# Patient Record
Sex: Male | Born: 1945 | Race: White | Hispanic: No | Marital: Single | Smoking: Never smoker
Health system: Southern US, Community
[De-identification: ages and names within clinical notes are randomized; demographics above are authoritative.]

---

## 1999-04-02 ENCOUNTER — Other Ambulatory Visit: Admission: RE | Admit: 1999-04-02 | Discharge: 1999-04-02 | Payer: Self-pay | Admitting: Oncology

## 1999-06-06 ENCOUNTER — Encounter: Payer: Self-pay | Admitting: Rheumatology

## 1999-06-06 ENCOUNTER — Encounter: Admission: RE | Admit: 1999-06-06 | Discharge: 1999-06-06 | Payer: Self-pay | Admitting: Rheumatology

## 1999-07-18 ENCOUNTER — Encounter: Admission: RE | Admit: 1999-07-18 | Discharge: 1999-07-18 | Payer: Self-pay | Admitting: Family Medicine

## 1999-07-18 ENCOUNTER — Encounter: Payer: Self-pay | Admitting: Family Medicine

## 2007-01-14 ENCOUNTER — Encounter: Admission: RE | Admit: 2007-01-14 | Discharge: 2007-01-14 | Payer: Self-pay | Admitting: Family Medicine

## 2007-01-17 ENCOUNTER — Ambulatory Visit (HOSPITAL_COMMUNITY): Admission: RE | Admit: 2007-01-17 | Discharge: 2007-01-17 | Payer: Self-pay | Admitting: Family Medicine

## 2008-02-07 ENCOUNTER — Ambulatory Visit: Payer: Self-pay | Admitting: Diagnostic Radiology

## 2008-02-07 ENCOUNTER — Ambulatory Visit (HOSPITAL_BASED_OUTPATIENT_CLINIC_OR_DEPARTMENT_OTHER): Admission: RE | Admit: 2008-02-07 | Discharge: 2008-02-07 | Payer: Self-pay | Admitting: Family Medicine

## 2017-11-22 ENCOUNTER — Encounter (HOSPITAL_COMMUNITY): Payer: Self-pay | Admitting: Emergency Medicine

## 2017-11-22 ENCOUNTER — Other Ambulatory Visit: Payer: Self-pay

## 2017-11-22 ENCOUNTER — Inpatient Hospital Stay (HOSPITAL_COMMUNITY)
Admission: EM | Admit: 2017-11-22 | Discharge: 2017-11-25 | DRG: 443 | Disposition: A | Discharge status: Home / Self Care | Payer: Medicare Other | Source: Ambulatory Visit | Attending: Internal Medicine | Admitting: Internal Medicine

## 2017-11-22 ENCOUNTER — Emergency Department (HOSPITAL_COMMUNITY): Payer: Medicare Other

## 2017-11-22 DIAGNOSIS — B179 Acute viral hepatitis, unspecified: Secondary | ICD-10-CM | POA: Diagnosis not present

## 2017-11-22 DIAGNOSIS — Z791 Long term (current) use of non-steroidal anti-inflammatories (NSAID): Secondary | ICD-10-CM

## 2017-11-22 DIAGNOSIS — Z833 Family history of diabetes mellitus: Secondary | ICD-10-CM

## 2017-11-22 DIAGNOSIS — R740 Nonspecific elevation of levels of transaminase and lactic acid dehydrogenase [LDH]: Secondary | ICD-10-CM | POA: Diagnosis not present

## 2017-11-22 DIAGNOSIS — M549 Dorsalgia, unspecified: Secondary | ICD-10-CM | POA: Diagnosis present

## 2017-11-22 DIAGNOSIS — K7200 Acute and subacute hepatic failure without coma: Secondary | ICD-10-CM | POA: Insufficient documentation

## 2017-11-22 DIAGNOSIS — B169 Acute hepatitis B without delta-agent and without hepatic coma: Secondary | ICD-10-CM | POA: Diagnosis present

## 2017-11-22 DIAGNOSIS — Z8249 Family history of ischemic heart disease and other diseases of the circulatory system: Secondary | ICD-10-CM | POA: Diagnosis not present

## 2017-11-22 DIAGNOSIS — K7290 Hepatic failure, unspecified without coma: Secondary | ICD-10-CM | POA: Insufficient documentation

## 2017-11-22 DIAGNOSIS — F329 Major depressive disorder, single episode, unspecified: Secondary | ICD-10-CM | POA: Diagnosis present

## 2017-11-22 DIAGNOSIS — G8929 Other chronic pain: Secondary | ICD-10-CM | POA: Diagnosis present

## 2017-11-22 DIAGNOSIS — M545 Low back pain: Secondary | ICD-10-CM | POA: Diagnosis not present

## 2017-11-22 DIAGNOSIS — R7401 Elevation of levels of liver transaminase levels: Secondary | ICD-10-CM

## 2017-11-22 LAB — COMPREHENSIVE METABOLIC PANEL
ALBUMIN: 3.1 g/dL — AB (ref 3.5–5.0)
ALT: 3122 U/L — ABNORMAL HIGH (ref 0–44)
ANION GAP: 6 (ref 5–15)
AST: 2438 U/L — ABNORMAL HIGH (ref 15–41)
Alkaline Phosphatase: 157 U/L — ABNORMAL HIGH (ref 38–126)
BUN: 9 mg/dL (ref 8–23)
CHLORIDE: 107 mmol/L (ref 98–111)
CO2: 24 mmol/L (ref 22–32)
Calcium: 8.6 mg/dL — ABNORMAL LOW (ref 8.9–10.3)
Creatinine, Ser: 0.55 mg/dL — ABNORMAL LOW (ref 0.61–1.24)
GFR calc non Af Amer: >60 mL/min (ref >60)
GLUCOSE: 108 mg/dL — AB (ref 70–99)
POTASSIUM: 3.1 mmol/L — AB (ref 3.5–5.1)
SODIUM: 137 mmol/L (ref 135–145)
Total Bilirubin: 15.7 mg/dL — ABNORMAL HIGH (ref 0.3–1.2)
Total Protein: 7.1 g/dL (ref 6.5–8.1)

## 2017-11-22 LAB — URINALYSIS, ROUTINE W REFLEX MICROSCOPIC
Glucose, UA: NEGATIVE mg/dL
HGB URINE DIPSTICK: NEGATIVE
KETONES UR: NEGATIVE mg/dL
LEUKOCYTES UA: NEGATIVE
Nitrite: NEGATIVE
Protein, ur: 30 mg/dL — AB
Specific Gravity, Urine: 1.023 (ref 1.005–1.030)
pH: 6 (ref 5.0–8.0)

## 2017-11-22 LAB — CBC
HEMATOCRIT: 38.6 % — AB (ref 39.0–52.0)
HEMOGLOBIN: 13.2 g/dL (ref 13.0–17.0)
MCH: 30.8 pg (ref 26.0–34.0)
MCHC: 34.2 g/dL (ref 30.0–36.0)
MCV: 90 fL (ref 80.0–100.0)
NRBC: 0.3 % — AB (ref 0.0–0.2)
Platelets: 138 10*3/uL — ABNORMAL LOW (ref 150–400)
RBC: 4.29 MIL/uL (ref 4.22–5.81)
RDW: 16.3 % — ABNORMAL HIGH (ref 11.5–15.5)
WBC: 7 10*3/uL (ref 4.0–10.5)

## 2017-11-22 LAB — LIPASE, BLOOD: LIPASE: 72 U/L — AB (ref 11–51)

## 2017-11-22 LAB — ACETAMINOPHEN LEVEL: Acetaminophen (Tylenol), Serum: <10 ug/mL — ABNORMAL LOW (ref 10–30)

## 2017-11-22 LAB — PROTIME-INR
INR: 1.44
PROTHROMBIN TIME: 17.3 s — AB (ref 11.4–15.2)

## 2017-11-22 MED ORDER — IOPAMIDOL (ISOVUE-300) INJECTION 61%
INTRAVENOUS | Status: AC
  Administered 2017-11-22: 100 mL via INTRAVENOUS
  Filled 2017-11-22: qty 100

## 2017-11-22 MED ORDER — IOPAMIDOL (ISOVUE-300) INJECTION 61%
100.0000 mL | Freq: Once | INTRAVENOUS | Status: AC | PRN
  Administered 2017-11-22: 100 mL via INTRAVENOUS

## 2017-11-22 MED ORDER — SODIUM CHLORIDE 0.9 % IJ SOLN
INTRAMUSCULAR | Status: AC
  Filled 2017-11-22: qty 50

## 2017-11-22 MED ORDER — ONDANSETRON HCL 4 MG/2ML IJ SOLN
4.0000 mg | Freq: Once | INTRAMUSCULAR | Status: AC
  Administered 2017-11-22: 4 mg via INTRAVENOUS
  Filled 2017-11-22: qty 2

## 2017-11-22 MED ORDER — HYDROMORPHONE HCL 1 MG/ML IJ SOLN
1.0000 mg | Freq: Once | INTRAMUSCULAR | Status: AC
  Administered 2017-11-22: 1 mg via INTRAVENOUS
  Filled 2017-11-22: qty 1

## 2017-11-22 NOTE — ED Notes (Signed)
PT states he feels "sick to his stomach" after pain medication administration. Dr. Donnald Garre made aware and verbal order given for zofran.

## 2017-11-22 NOTE — ED Provider Notes (Signed)
[facility]-EMERGENCY DEPT Provider Note   CSN: [id number] Arrival date & time: 11/22/17  1631     History   Chief Complaint Chief Complaint  Patient presents with  . Abnormal Lab    HPI Peter Lara is a 72 y.o. male.  HPI Patient reports that he started developing some lower abdominal discomfort about 8 days ago.  He reports that it was generalized and aching in nature.  He denies any upper abdominal pain.  Ports he developed diarrhea for period of time.  No fever that he is aware of.  No vomiting.  No vomiting of blood.  He is under pain management for chronic lower back pain.  He takes Vicodin, 1 tablet 4 times a day.  He denies that he takes other acetaminophen products or takes more than his prescribed amount.  He denies any alcohol history.  He denies history of liver problems.  When he went to his regular follow-up at pain management today, he was told that he looked jaundiced and he needed to see his primary care doctor go to the emergency department.  He reports he went to his PCP who advised him to come to the emergency department. History reviewed. No pertinent past medical history.  Patient Active Problem List   Diagnosis Date Noted  . Hepatic failure (HCC) 11/22/2017  . Acute hepatic failure 11/22/2017    History reviewed. No pertinent surgical history.      Home Medications    Prior to Admission medications   Medication Sig Start Date End Date Taking? Authorizing Provider  Carboxymethylcellulose Sodium (EYE DROPS OP) Apply 1 drop to eye daily as needed (dry eyes).   Yes [provider]  gabapentin (NEURONTIN) 600 MG tablet Take 600 mg by mouth 3 (three) times daily.    Yes [provider]  HYDROcodone-acetaminophen (NORCO) 7.5-325 MG tablet Take 1 tablet by mouth every 6 (six) hours as needed for pain.   Yes [provider]  meloxicam (MOBIC) 15 MG tablet Take 15 mg by mouth daily.   Yes [provider]  mirtazapine (REMERON) 15 MG tablet Take 15 mg by mouth at bedtime.   Yes [provider]  sertraline (ZOLOFT) 50 MG tablet Take 75 mg by mouth daily.    Yes [provider]    Family History Family History  Problem Relation Age of Onset  . CAD Father   . Diabetes Mellitus II Brother     Social History Social History   Tobacco Use  . Smoking status: Never Smoker  . Smokeless tobacco: Never Used  Substance Use Topics  . Alcohol use: Not Currently  . Drug use: Not on file     Allergies   Patient has no known allergies.   Review of Systems Review of Systems  10 Systems reviewed and are negative for acute change except as noted in the HPI.  Physical Exam Updated Vital Signs BP 138/80 (BP Location: Right Arm)   Pulse 67   Temp 98.3 F (36.8 C)   Resp 14   SpO2 96%   Physical Exam  Constitutional: He is oriented to person, place, and time.  Patient is alert with clear mental status.  No respiratory distress.  Nontoxic.  Obviously jaundiced.  HENT:  Head: Normocephalic and atraumatic.  Mouth/Throat: Oropharynx is clear and moist.  Eyes: Pupils are equal, round, and reactive to light. EOM are normal. Scleral icterus is present.  Neck: Neck supple.  Cardiovascular: Normal rate, regular  rhythm, normal heart sounds and intact distal pulses.  Pulmonary/Chest: Effort normal and breath sounds normal.  Abdominal: Soft. He exhibits no distension. There is no tenderness. There is no guarding.  Musculoskeletal: Normal range of motion. He exhibits no edema or tenderness.  Neurological: He is alert and oriented to person, place, and time. No cranial nerve deficit. He exhibits normal muscle tone. Coordination normal.  Skin: Skin is warm and dry.  Patient is very jaundiced in appearance.  Psychiatric: He has a normal mood and affect.     ED Treatments / Results  Labs (all labs ordered are listed, but only abnormal results are displayed) Labs Reviewed    LIPASE, BLOOD - Abnormal; Notable for the following components:      Result Value   Lipase 72 (*)    All other components within normal limits  COMPREHENSIVE METABOLIC PANEL - Abnormal; Notable for the following components:   Potassium 3.1 (*)    Glucose, Bld 108 (*)    Creatinine, Ser 0.55 (*)    Calcium 8.6 (*)    Albumin 3.1 (*)    AST 2,438 (*)    ALT 3,122 (*)    Alkaline Phosphatase 157 (*)    Total Bilirubin 15.7 (*)    All other components within normal limits  CBC - Abnormal; Notable for the following components:   HCT 38.6 (*)    RDW 16.3 (*)    Platelets 138 (*)    nRBC 0.3 (*)    All other components within normal limits  URINALYSIS, ROUTINE W REFLEX MICROSCOPIC - Abnormal; Notable for the following components:   Color, Urine AMBER (*)    APPearance HAZY (*)    Bilirubin Urine MODERATE (*)    Protein, ur 30 (*)    Bacteria, UA RARE (*)    All other components within normal limits  PROTIME-INR - Abnormal; Notable for the following components:   Prothrombin Time 17.3 (*)    All other components within normal limits  ACETAMINOPHEN LEVEL - Abnormal; Notable for the following components:   Acetaminophen (Tylenol), Serum <10 (*)    All other components within normal limits    EKG None  Radiology Ct Chest W Contrast  Result Date: 11/22/2017 CLINICAL DATA:  Jaundice. EXAM: CT CHEST, ABDOMEN, AND PELVIS WITH CONTRAST TECHNIQUE: Multidetector CT imaging of the chest, abdomen and pelvis was performed following the standard protocol during bolus administration of intravenous contrast. CONTRAST:  ISOVUE-300 IOPAMIDOL (ISOVUE-300) INJECTION 61% COMPARISON:  None. FINDINGS: CT CHEST FINDINGS Cardiovascular: Heart size is normal. No pericardial effusion. Mild aortic atherosclerosis. No thoracic aortic aneurysm. Mediastinum/Nodes: No mass or enlarged lymph nodes seen within the mediastinum or perihilar regions. Esophagus appears normal. Trachea and central bronchi are  unremarkable. Lungs/Pleura: Mild bibasilar atelectasis/scarring. Lungs otherwise clear. No pleural effusion or pneumothorax. Musculoskeletal: Mild degenerative spondylosis of the thoracic spine. No acute or suspicious osseous finding. CT ABDOMEN PELVIS FINDINGS Hepatobiliary: No focal liver abnormality. Mild fatty infiltration of the liver. Questionable gallbladder wall thickening. There is pericholecystic fluid/edema. No bile duct stone seen. Common bile duct appears upper normal in caliber. Pancreas: Unremarkable. No pancreatic ductal dilatation or surrounding inflammatory changes. Spleen: Normal in size without focal abnormality. Adrenals/Urinary Tract: Adrenal glands are unremarkable. Kidneys are normal, without renal calculi, focal lesion, or hydronephrosis. Bladder is unremarkable. Stomach/Bowel: No dilated large or small bowel loops. Vascular/Lymphatic: No acute appearing vascular abnormality. Mild aortic atherosclerosis. Several enlarged lymph nodes are seen in the RIGHT upper quadrant, including a 15  mm short axis lymph node in the portacaval space (series 2, image 66), likely reactive in nature. Reproductive: Prostate gland is moderately enlarged causing slight mass effect on the bladder base. Other: Small amount of free fluid in the lower pelvis. No abscess collection identified. No free intraperitoneal air. Musculoskeletal: No acute or suspicious osseous finding. Degenerative spondylosis of the lumbar spine, mild to moderate in degree. LEFT inguinal hernia which contains fat only. IMPRESSION: 1. Pericholecystic fluid/edema and questionable gallbladder wall thickening, highly suspicious for acute cholecystitis. Recommend confirmation with RIGHT upper quadrant ultrasound. 2. Common bile duct appears to be within normal limits in caliber, perhaps borderline dilated. No bile duct stone identified. More definitive CBD measurement can be obtained on the RIGHT upper quadrant ultrasound recommended above. 3.  Mild fatty infiltration of the liver. 4. Enlarged lymph nodes in the RIGHT upper quadrant, likely reactive in nature. 5. No acute findings within the chest. 6. Moderate prostate gland enlargement. Consider correlation with PSA lab values. Electronically Signed   By: Bary Richard M.D.   On: 11/22/2017 21:35   Ct Abdomen Pelvis W Contrast  Result Date: 11/22/2017 CLINICAL DATA:  Jaundice. EXAM: CT CHEST, ABDOMEN, AND PELVIS WITH CONTRAST TECHNIQUE: Multidetector CT imaging of the chest, abdomen and pelvis was performed following the standard protocol during bolus administration of intravenous contrast. CONTRAST:  ISOVUE-300 IOPAMIDOL (ISOVUE-300) INJECTION 61% COMPARISON:  None. FINDINGS: CT CHEST FINDINGS Cardiovascular: Heart size is normal. No pericardial effusion. Mild aortic atherosclerosis. No thoracic aortic aneurysm. Mediastinum/Nodes: No mass or enlarged lymph nodes seen within the mediastinum or perihilar regions. Esophagus appears normal. Trachea and central bronchi are unremarkable. Lungs/Pleura: Mild bibasilar atelectasis/scarring. Lungs otherwise clear. No pleural effusion or pneumothorax. Musculoskeletal: Mild degenerative spondylosis of the thoracic spine. No acute or suspicious osseous finding. CT ABDOMEN PELVIS FINDINGS Hepatobiliary: No focal liver abnormality. Mild fatty infiltration of the liver. Questionable gallbladder wall thickening. There is pericholecystic fluid/edema. No bile duct stone seen. Common bile duct appears upper normal in caliber. Pancreas: Unremarkable. No pancreatic ductal dilatation or surrounding inflammatory changes. Spleen: Normal in size without focal abnormality. Adrenals/Urinary Tract: Adrenal glands are unremarkable. Kidneys are normal, without renal calculi, focal lesion, or hydronephrosis. Bladder is unremarkable. Stomach/Bowel: No dilated large or small bowel loops. Vascular/Lymphatic: No acute appearing vascular abnormality. Mild aortic atherosclerosis.  Several enlarged lymph nodes are seen in the RIGHT upper quadrant, including a 15 mm short axis lymph node in the portacaval space (series 2, image 66), likely reactive in nature. Reproductive: Prostate gland is moderately enlarged causing slight mass effect on the bladder base. Other: Small amount of free fluid in the lower pelvis. No abscess collection identified. No free intraperitoneal air. Musculoskeletal: No acute or suspicious osseous finding. Degenerative spondylosis of the lumbar spine, mild to moderate in degree. LEFT inguinal hernia which contains fat only. IMPRESSION: 1. Pericholecystic fluid/edema and questionable gallbladder wall thickening, highly suspicious for acute cholecystitis. Recommend confirmation with RIGHT upper quadrant ultrasound. 2. Common bile duct appears to be within normal limits in caliber, perhaps borderline dilated. No bile duct stone identified. More definitive CBD measurement can be obtained on the RIGHT upper quadrant ultrasound recommended above. 3. Mild fatty infiltration of the liver. 4. Enlarged lymph nodes in the RIGHT upper quadrant, likely reactive in nature. 5. No acute findings within the chest. 6. Moderate prostate gland enlargement. Consider correlation with PSA lab values. Electronically Signed   By: Bary Richard M.D.   On: 11/22/2017 21:35    Procedures Procedures (including critical  care time) CRITICAL CARE Performed by: Arby Barrette   Total critical care time: 30 minutes  Critical care time was exclusive of separately billable procedures and treating other patients.  Critical care was necessary to treat or prevent imminent or life-threatening deterioration.  Critical care was time spent personally by me on the following activities: development of treatment plan with patient and/or surrogate as well as nursing, discussions with consultants, evaluation of patient's response to treatment, examination of patient, obtaining history from patient or  surrogate, ordering and performing treatments and interventions, ordering and review of laboratory studies, ordering and review of radiographic studies, pulse oximetry and re-evaluation of patient's condition. Medications Ordered in ED Medications  sodium chloride 0.9 % injection (has no administration in time range)  HYDROmorphone (DILAUDID) injection 1 mg (1 mg Intravenous Given 11/22/17 2013)  ondansetron (ZOFRAN) injection 4 mg (4 mg Intravenous Given 11/22/17 2023)  iopamidol (ISOVUE-300) 61 % injection 100 mL (100 mLs Intravenous Contrast Given 11/22/17 2112)     Initial Impression / Assessment and Plan / ED Course  I have reviewed the triage vital signs and the nursing notes.  Pertinent labs & imaging results that were available during my care of the patient were reviewed by me and considered in my medical decision making (see chart for details).  Clinical Course as of Nov 23 41  Mon Nov 22, 2017  2153 Consult: Reviewed with Dr. Gerrit Friends.  Will consult on the patient.  He does have advised patient is for medical admission for fulminant hepatic failure unlikely to be due to cholecystitis.   [MP]  2159 Consult: Reviewed to Dr. Toniann Fail.  Advises to consult gastroenterology.   [MP]  2239 Lower GI returned page.  Advised that [facility] is covered by [employer] and please reconsult for [facility].  Consult reordered.   [MP]    Clinical Course User Index [MP] Arby Barrette, MD   Consult: Reviewed with Dr. Matthias Hughs.  He advises that at this time, with no encephalopathy, stable vital signs and no INR elevation, which does not suggest empiric Mucomyst.  Observation overnight and GI will consult in the morning.  Final Clinical Impressions(s) / ED Diagnoses   Final diagnoses:  Acute liver failure without hepatic coma    ED Discharge Orders    None       Arby Barrette, MD 11/23/17 [phone]

## 2017-11-22 NOTE — ED Notes (Signed)
Pt transported to CT by CT tech

## 2017-11-22 NOTE — ED Triage Notes (Signed)
Pt reports that he was at pain specialist today and was told that hi eyes were yellow and he needed to got to the ED.  Pt states that he went to PCP at [facility] and had blood work and urine done. Was called and told to go to the ED due to liver levels high. Pt c/o abd pains for 8 days and diarrhea and dark urine for 7 days.

## 2017-11-22 NOTE — Consult Note (Signed)
General Surgery [facility]  Reason for Consult: jaundice  Referring Physician: Dr. Johnney Killian, [facility]  Peter Lara is an 72 y.o. male.  HPI: patient is a 72 yo WM who was seen this AM by his pain clinic provider in [place].  He was noted to be jaundiced.  He complained of one week hx of lower abd pain and diarrhea.  He was sent to the [facility] medical clinic at [facility] and evaluated.  Labs showed elevated LFT's and patient was referred to [facility] for further evaluation.  Patient has been on high dose gabapentin, hydrocodone, and hydromorphone for back pain.  No hx of hepatocellular disease previously.  Denies hepatitis.  Denies alcohol use to excess.  Labs show Tbili > 15 and markedly elevated transaminases with a relatively normal alk phos.  CT shows some pericholecystic fluid and possible gallbladder wall thickening.  No obvious stones.  No ductal dilatation.  No prior abdominal surgery.  History reviewed. No pertinent past medical history.  History reviewed. No pertinent surgical history.  History reviewed. No pertinent family history.  Social History:  reports that he has never smoked. He has never used smokeless tobacco. He reports that he drank alcohol. His drug history is not on file.  Allergies: No Known Allergies  Medications: I have reviewed the patient's current medications.  Results for orders placed or performed during the hospital encounter of 11/22/17 (from the past 48 hour(s))  Lipase, blood     Status: Abnormal   Collection Time: 11/22/17  5:10 PM  Result Value Ref Range   Lipase 72 (H) 11 - 51 U/L    Comment: Performed at [facility], [place] [address], [place], [place] [address]  Comprehensive metabolic panel     Status: Abnormal   Collection Time: 11/22/17  5:10 PM  Result Value Ref Range   Sodium 137 135 - 145 mmol/L   Potassium 3.1 (L) 3.5 - 5.1 mmol/L   Chloride 107 98 - 111 mmol/L   CO2 24 22 - 32 mmol/L   Glucose,  Bld 108 (H) 70 - 99 mg/dL   BUN 9 8 - 23 mg/dL   Creatinine, Ser 0.55 (L) 0.61 - 1.24 mg/dL   Calcium 8.6 (L) 8.9 - 10.3 mg/dL   Total Protein 7.1 6.5 - 8.1 g/dL   Albumin 3.1 (L) 3.5 - 5.0 g/dL   AST 2,438 (H) 15 - 41 U/L    Comment: RESULTS CONFIRMED BY MANUAL DILUTION   ALT 3,122 (H) 0 - 44 U/L    Comment: RESULTS CONFIRMED BY MANUAL DILUTION   Alkaline Phosphatase 157 (H) 38 - 126 U/L   Total Bilirubin 15.7 (H) 0.3 - 1.2 mg/dL   GFR calc non Af Amer >60 >60 mL/min   GFR calc Af Amer >60 >60 mL/min    Comment: (NOTE) The eGFR has been calculated using the CKD EPI equation. This calculation has not been validated in all clinical situations. eGFR's persistently <60 mL/min signify possible Chronic Kidney Disease.    Anion gap 6 5 - 15    Comment: Performed at [facility], [place] [address], [place], [place] [address]  CBC     Status: Abnormal   Collection Time: 11/22/17  5:10 PM  Result Value Ref Range   WBC 7.0 4.0 - 10.5 K/uL   RBC 4.29 4.22 - 5.81 MIL/uL   Hemoglobin 13.2 13.0 - 17.0 g/dL   HCT 38.6 (L) 39.0 - 52.0 %  MCV 90.0 80.0 - 100.0 fL   MCH 30.8 26.0 - 34.0 pg   MCHC 34.2 30.0 - 36.0 g/dL   RDW 16.3 (H) 11.5 - 15.5 %   Platelets 138 (L) 150 - 400 K/uL    Comment: REPEATED TO VERIFY   nRBC 0.3 (H) 0.0 - 0.2 %    Comment: Performed at [facility], [place] [address], [place], [place] [address]  Protime-INR     Status: Abnormal   Collection Time: 11/22/17  5:10 PM  Result Value Ref Range   Prothrombin Time 17.3 (H) 11.4 - 15.2 seconds   INR 1.44     Comment: Performed at [facility], [place] [address] [place], [place] [address]  Urinalysis, Routine w reflex microscopic     Status: Abnormal   Collection Time: 11/22/17  6:00 PM  Result Value Ref Range   Color, Urine AMBER (A) YELLOW    Comment: BIOCHEMICALS MAY BE AFFECTED BY COLOR   APPearance HAZY (A) CLEAR   Specific Gravity, Urine 1.023 1.005 - 1.030   pH  6.0 5.0 - 8.0   Glucose, UA NEGATIVE NEGATIVE mg/dL   Hgb urine dipstick NEGATIVE NEGATIVE   Bilirubin Urine MODERATE (A) NEGATIVE   Ketones, ur NEGATIVE NEGATIVE mg/dL   Protein, ur 30 (A) NEGATIVE mg/dL   Nitrite NEGATIVE NEGATIVE   Leukocytes, UA NEGATIVE NEGATIVE   RBC / HPF 21-50 0 - 5 RBC/hpf   WBC, UA 6-10 0 - 5 WBC/hpf   Bacteria, UA RARE (A) NONE SEEN   Squamous Epithelial / LPF 0-5 0 - 5   Mucus PRESENT    Amorphous Crystal PRESENT     Comment: Performed at [facility], [place] [address], [place], [place] [address]    Ct Chest W Contrast  Result Date: 11/22/2017 CLINICAL DATA:  Jaundice. EXAM: CT CHEST, ABDOMEN, AND PELVIS WITH CONTRAST TECHNIQUE: Multidetector CT imaging of the chest, abdomen and pelvis was performed following the standard protocol during bolus administration of intravenous contrast. CONTRAST:  140m ISOVUE-300 IOPAMIDOL (ISOVUE-300) INJECTION 61% COMPARISON:  None. FINDINGS: CT CHEST FINDINGS Cardiovascular: Heart size is normal. No pericardial effusion. Mild aortic atherosclerosis. No thoracic aortic aneurysm. Mediastinum/Nodes: No mass or enlarged lymph nodes seen within the mediastinum or perihilar regions. Esophagus appears normal. Trachea and central bronchi are unremarkable. Lungs/Pleura: Mild bibasilar atelectasis/scarring. Lungs otherwise clear. No pleural effusion or pneumothorax. Musculoskeletal: Mild degenerative spondylosis of the thoracic spine. No acute or suspicious osseous finding. CT ABDOMEN PELVIS FINDINGS Hepatobiliary: No focal liver abnormality. Mild fatty infiltration of the liver. Questionable gallbladder wall thickening. There is pericholecystic fluid/edema. No bile duct stone seen. Common bile duct appears upper normal in caliber. Pancreas: Unremarkable. No pancreatic ductal dilatation or surrounding inflammatory changes. Spleen: Normal in size without focal abnormality. Adrenals/Urinary Tract: Adrenal glands are unremarkable.  Kidneys are normal, without renal calculi, focal lesion, or hydronephrosis. Bladder is unremarkable. Stomach/Bowel: No dilated large or small bowel loops. Vascular/Lymphatic: No acute appearing vascular abnormality. Mild aortic atherosclerosis. Several enlarged lymph nodes are seen in the RIGHT upper quadrant, including a 15 mm short axis lymph node in the portacaval space (series 2, image 66), likely reactive in nature. Reproductive: Prostate gland is moderately enlarged causing slight mass effect on the bladder base. Other: Small amount of free fluid in the lower pelvis. No abscess collection identified. No free intraperitoneal air. Musculoskeletal: No acute or suspicious osseous finding. Degenerative spondylosis of the lumbar spine, mild to moderate in degree. LEFT inguinal  hernia which contains fat only. IMPRESSION: 1. Pericholecystic fluid/edema and questionable gallbladder wall thickening, highly suspicious for acute cholecystitis. Recommend confirmation with RIGHT upper quadrant ultrasound. 2. Common bile duct appears to be within normal limits in caliber, perhaps borderline dilated. No bile duct stone identified. More definitive CBD measurement can be obtained on the RIGHT upper quadrant ultrasound recommended above. 3. Mild fatty infiltration of the liver. 4. Enlarged lymph nodes in the RIGHT upper quadrant, likely reactive in nature. 5. No acute findings within the chest. 6. Moderate prostate gland enlargement. Consider correlation with PSA lab values. Electronically Signed   By: Franki Cabot M.D.   On: 11/22/2017 21:35   Ct Abdomen Pelvis W Contrast  Result Date: 11/22/2017 CLINICAL DATA:  Jaundice. EXAM: CT CHEST, ABDOMEN, AND PELVIS WITH CONTRAST TECHNIQUE: Multidetector CT imaging of the chest, abdomen and pelvis was performed following the standard protocol during bolus administration of intravenous contrast. CONTRAST:  112m ISOVUE-300 IOPAMIDOL (ISOVUE-300) INJECTION 61% COMPARISON:  None.  FINDINGS: CT CHEST FINDINGS Cardiovascular: Heart size is normal. No pericardial effusion. Mild aortic atherosclerosis. No thoracic aortic aneurysm. Mediastinum/Nodes: No mass or enlarged lymph nodes seen within the mediastinum or perihilar regions. Esophagus appears normal. Trachea and central bronchi are unremarkable. Lungs/Pleura: Mild bibasilar atelectasis/scarring. Lungs otherwise clear. No pleural effusion or pneumothorax. Musculoskeletal: Mild degenerative spondylosis of the thoracic spine. No acute or suspicious osseous finding. CT ABDOMEN PELVIS FINDINGS Hepatobiliary: No focal liver abnormality. Mild fatty infiltration of the liver. Questionable gallbladder wall thickening. There is pericholecystic fluid/edema. No bile duct stone seen. Common bile duct appears upper normal in caliber. Pancreas: Unremarkable. No pancreatic ductal dilatation or surrounding inflammatory changes. Spleen: Normal in size without focal abnormality. Adrenals/Urinary Tract: Adrenal glands are unremarkable. Kidneys are normal, without renal calculi, focal lesion, or hydronephrosis. Bladder is unremarkable. Stomach/Bowel: No dilated large or small bowel loops. Vascular/Lymphatic: No acute appearing vascular abnormality. Mild aortic atherosclerosis. Several enlarged lymph nodes are seen in the RIGHT upper quadrant, including a 15 mm short axis lymph node in the portacaval space (series 2, image 66), likely reactive in nature. Reproductive: Prostate gland is moderately enlarged causing slight mass effect on the bladder base. Other: Small amount of free fluid in the lower pelvis. No abscess collection identified. No free intraperitoneal air. Musculoskeletal: No acute or suspicious osseous finding. Degenerative spondylosis of the lumbar spine, mild to moderate in degree. LEFT inguinal hernia which contains fat only. IMPRESSION: 1. Pericholecystic fluid/edema and questionable gallbladder wall thickening, highly suspicious for acute  cholecystitis. Recommend confirmation with RIGHT upper quadrant ultrasound. 2. Common bile duct appears to be within normal limits in caliber, perhaps borderline dilated. No bile duct stone identified. More definitive CBD measurement can be obtained on the RIGHT upper quadrant ultrasound recommended above. 3. Mild fatty infiltration of the liver. 4. Enlarged lymph nodes in the RIGHT upper quadrant, likely reactive in nature. 5. No acute findings within the chest. 6. Moderate prostate gland enlargement. Consider correlation with PSA lab values. Electronically Signed   By: SFranki CabotM.D.   On: 11/22/2017 21:35    Review of Systems  Constitutional: Negative.   HENT: Negative.   Eyes: Negative.   Respiratory: Negative.   Cardiovascular: Negative.   Gastrointestinal: Positive for abdominal pain and diarrhea. Negative for nausea and vomiting.  Genitourinary: Negative.   Musculoskeletal: Positive for back pain.  Skin:       jaundice  Neurological: Negative.   Endo/Heme/Allergies: Negative.   Psychiatric/Behavioral: Negative.    Blood pressure 138/80, pulse  67, temperature 98.3 F (36.8 C), resp. rate 14, SpO2 96 %. Physical Exam  Constitutional: He is oriented to person, place, and time. He appears well-developed and well-nourished. No distress.  HENT:  Head: Normocephalic and atraumatic.  Right Ear: External ear normal.  Left Ear: External ear normal.  Eyes: Pupils are equal, round, and reactive to light. Scleral icterus is present.  Neck: Normal range of motion. Neck supple. No tracheal deviation present. No thyromegaly present.  Cardiovascular: Normal rate and normal heart sounds.  No murmur heard. Respiratory: Effort normal and breath sounds normal. No respiratory distress. He has no wheezes.  GI: Soft. Bowel sounds are normal. He exhibits no distension. There is no tenderness.  Rectus diastasis  Musculoskeletal: Normal range of motion. He exhibits no edema or deformity.   Lymphadenopathy:    He has no cervical adenopathy.  Neurological: He is alert and oriented to person, place, and time.  Skin: Skin is warm and dry. He is not diaphoretic.  jaundice  Psychiatric: He has a normal mood and affect. His behavior is normal.    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  No role for acute operative intervention.  Armandina Gemma, [facility] Office: [place] 11/22/2017, 10:53 PM

## 2017-11-23 ENCOUNTER — Encounter (HOSPITAL_COMMUNITY): Payer: Self-pay | Admitting: Internal Medicine

## 2017-11-23 ENCOUNTER — Inpatient Hospital Stay (HOSPITAL_COMMUNITY): Payer: Medicare Other

## 2017-11-23 DIAGNOSIS — M549 Dorsalgia, unspecified: Secondary | ICD-10-CM

## 2017-11-23 DIAGNOSIS — B179 Acute viral hepatitis, unspecified: Secondary | ICD-10-CM | POA: Diagnosis present

## 2017-11-23 DIAGNOSIS — G8929 Other chronic pain: Secondary | ICD-10-CM | POA: Diagnosis present

## 2017-11-23 LAB — LACTATE DEHYDROGENASE: LDH: 501 U/L — ABNORMAL HIGH (ref 98–192)

## 2017-11-23 LAB — CBC WITH DIFFERENTIAL/PLATELET
Abs Immature Granulocytes: 0.14 10*3/uL — ABNORMAL HIGH (ref 0.00–0.07)
BASOS PCT: 1 %
Basophils Absolute: 0.1 10*3/uL (ref 0.0–0.1)
EOS ABS: 0.1 10*3/uL (ref 0.0–0.5)
EOS PCT: 1 %
HEMATOCRIT: 38.9 % — AB (ref 39.0–52.0)
Hemoglobin: 13.4 g/dL (ref 13.0–17.0)
Immature Granulocytes: 2 %
LYMPHS ABS: 1.5 10*3/uL (ref 0.7–4.0)
Lymphocytes Relative: 24 %
MCH: 30.6 pg (ref 26.0–34.0)
MCHC: 34.4 g/dL (ref 30.0–36.0)
MCV: 88.8 fL (ref 80.0–100.0)
MONOS PCT: 17 %
Monocytes Absolute: 1.1 10*3/uL — ABNORMAL HIGH (ref 0.1–1.0)
NEUTROS PCT: 55 %
NRBC: 0 % (ref 0.0–0.2)
Neutro Abs: 3.5 10*3/uL (ref 1.7–7.7)
PLATELETS: 129 10*3/uL — AB (ref 150–400)
RBC: 4.38 MIL/uL (ref 4.22–5.81)
RDW: 16.1 % — ABNORMAL HIGH (ref 11.5–15.5)
WBC: 6.4 10*3/uL (ref 4.0–10.5)

## 2017-11-23 LAB — BASIC METABOLIC PANEL
Anion gap: 6 (ref 5–15)
BUN: 10 mg/dL (ref 8–23)
CALCIUM: 8.7 mg/dL — AB (ref 8.9–10.3)
CO2: 25 mmol/L (ref 22–32)
Chloride: 107 mmol/L (ref 98–111)
Creatinine, Ser: 0.45 mg/dL — ABNORMAL LOW (ref 0.61–1.24)
GFR calc Af Amer: >60 mL/min (ref >60)
GLUCOSE: 90 mg/dL (ref 70–99)
Potassium: 3.2 mmol/L — ABNORMAL LOW (ref 3.5–5.1)
Sodium: 138 mmol/L (ref 135–145)

## 2017-11-23 LAB — HEPATIC FUNCTION PANEL
ALT: 2901 U/L — AB (ref 0–44)
AST: 2252 U/L — ABNORMAL HIGH (ref 15–41)
Albumin: 2.9 g/dL — ABNORMAL LOW (ref 3.5–5.0)
Alkaline Phosphatase: 154 U/L — ABNORMAL HIGH (ref 38–126)
BILIRUBIN DIRECT: 9.3 mg/dL — AB (ref 0.0–0.2)
BILIRUBIN INDIRECT: 6.1 mg/dL — AB (ref 0.3–0.9)
BILIRUBIN TOTAL: 15.4 mg/dL — AB (ref 0.3–1.2)
Total Protein: 6.7 g/dL (ref 6.5–8.1)

## 2017-11-23 LAB — HIV ANTIBODY (ROUTINE TESTING W REFLEX): HIV SCREEN 4TH GENERATION: NONREACTIVE

## 2017-11-23 LAB — RAPID URINE DRUG SCREEN, HOSP PERFORMED
AMPHETAMINES: NOT DETECTED
BENZODIAZEPINES: NOT DETECTED
Barbiturates: NOT DETECTED
COCAINE: NOT DETECTED
Opiates: POSITIVE — AB
TETRAHYDROCANNABINOL: NOT DETECTED

## 2017-11-23 MED ORDER — ONDANSETRON HCL 4 MG PO TABS: 4.0000 mg | ORAL_TABLET | Freq: Four times a day (QID) | ORAL | Status: DC | PRN

## 2017-11-23 MED ORDER — HYDROMORPHONE HCL 1 MG/ML IJ SOLN: 1.0000 mg | INTRAMUSCULAR | Status: DC | PRN

## 2017-11-23 MED ORDER — SODIUM CHLORIDE 0.9 % IV SOLN
INTRAVENOUS | Status: AC
  Administered 2017-11-23 (×2): via INTRAVENOUS

## 2017-11-23 MED ORDER — POTASSIUM CHLORIDE CRYS ER 20 MEQ PO TBCR
40.0000 meq | EXTENDED_RELEASE_TABLET | Freq: Once | ORAL | Status: AC
  Administered 2017-11-23: 40 meq via ORAL
  Filled 2017-11-23: qty 2

## 2017-11-23 MED ORDER — GABAPENTIN 300 MG PO CAPS
600.0000 mg | ORAL_CAPSULE | Freq: Three times a day (TID) | ORAL | Status: DC
  Administered 2017-11-23 – 2017-11-25 (×6): 600 mg via ORAL
  Filled 2017-11-23 (×6): qty 2

## 2017-11-23 MED ORDER — MIRTAZAPINE 15 MG PO TABS
15.0000 mg | ORAL_TABLET | Freq: Every day | ORAL | Status: DC
  Administered 2017-11-22 – 2017-11-24 (×3): 15 mg via ORAL
  Filled 2017-11-23 (×2): qty 1

## 2017-11-23 MED ORDER — ONDANSETRON HCL 4 MG/2ML IJ SOLN: 4.0000 mg | Freq: Four times a day (QID) | INTRAMUSCULAR | Status: DC | PRN

## 2017-11-23 MED ORDER — SERTRALINE HCL 50 MG PO TABS: 75.0000 mg | ORAL_TABLET | Freq: Every day | ORAL | Status: DC

## 2017-11-23 NOTE — Plan of Care (Signed)

## 2017-11-23 NOTE — H&P (Signed)
History and Physical    MAGDALENO LORTIE ZOX:[id number] DOB: January 31, 1946 DOA: 11/22/2017  PCP: Patient, No Pcp Per  Patient coming from: Home.  Chief Complaint: Jaundice.  HPI: Peter Lara is a 72 y.o. male with history of chronic back pain and depression was referred to the ER by patient's pain clinic after patient was found to be jaundiced.  Patient states over the last 1 week has been having lower abdominal pain with at least 3-4 episodes of watery diarrhea.  Denies any vomiting but had some nausea.  Patient also has been doing some right upper quadrant pain off and on for last few weeks.  Denies any chest pain or shortness of breath fever or chills.  Denies any travel outside of [place].  Denies any sick contacts.  ED Course: In the ER patient is found to be jaundiced.  And lab work show AST of 2438 and ALT of 3122.  Total bilirubin is 15.  Patient denies drinking alcohol does take Vicodin 3 to 4 tablets every day but has not taken any additional Tylenol.  Takes in addition gabapentin meloxicam sertraline and mirtazapine.  No suicidal ideation or any overdose.  CT scan of the chest and abdomen was done which shows features concerning for possible acute cholecystitis for which general surgery was consulted at this time general surgery is recommended to work-up for his acute hepatitis/hepatic failure.  INR is within normal limits.  Review of Systems: As per HPI, rest all negative.   History reviewed. No pertinent past medical history.  History reviewed. No pertinent surgical history.   reports that he has never smoked. He has never used smokeless tobacco. He reports that he drank alcohol. His drug history is not on file.  No Known Allergies  Family History  Problem Relation Age of Onset  . CAD Father   . Diabetes Mellitus II Brother     Prior to Admission medications   Medication Sig Start Date End Date Taking? Authorizing Provider  Carboxymethylcellulose Sodium (EYE DROPS  OP) Apply 1 drop to eye daily as needed (dry eyes).   Yes [provider]  gabapentin (NEURONTIN) 600 MG tablet Take 600 mg by mouth 3 (three) times daily.    Yes [provider]  HYDROcodone-acetaminophen (NORCO) 7.5-325 MG tablet Take 1 tablet by mouth every 6 (six) hours as needed for pain.   Yes [provider]  meloxicam (MOBIC) 15 MG tablet Take 15 mg by mouth daily.   Yes [provider]  mirtazapine (REMERON) 15 MG tablet Take 15 mg by mouth at bedtime.   Yes [provider]  sertraline (ZOLOFT) 50 MG tablet Take 75 mg by mouth daily.    Yes [provider]    Physical Exam: Vitals:   11/22/17 1939 11/22/17 2226 11/23/17 0045 11/23/17 0124  BP: (!) 157/89 138/80 (!) 141/75 (!) 155/86  Pulse: 72 67 65 61  Resp: 13 14 15 16   Temp:    98.2 F (36.8 C)  TempSrc:    Oral  SpO2: 100% 96% 94% 97%      Constitutional: Moderately built and nourished. Vitals:   11/22/17 1939 11/22/17 2226 11/23/17 0045 11/23/17 0124  BP: (!) 157/89 138/80 (!) 141/75 (!) 155/86  Pulse: 72 67 65 61  Resp: 13 14 15 16   Temp:    98.2 F (36.8 C)  TempSrc:    Oral  SpO2: 100% 96% 94% 97%   Eyes: Icterus present no pallor. ENMT: No discharge  from the ears eyes nose or mouth. Neck: No mass felt.  No neck rigidity.  No JVD appreciated. Respiratory: No rhonchi or crepitations. Cardiovascular: S1-S2 heard no murmurs appreciated. Abdomen: Soft nontender bowel sounds present. Musculoskeletal: No edema.  No joint effusion. Skin: No rash.  Skin appears warm. Neurologic: Alert awake oriented to time place and person.  Moves all extremities. Psychiatric: Appears normal per normal affect.   Labs on Admission: I have personally reviewed following labs and imaging studies  CBC: Recent Labs  Lab 11/22/17 1710  WBC 7.0  HGB 13.2  HCT 38.6*  MCV 90.0  PLT 138*   Basic Metabolic Panel: Recent Labs  Lab 11/22/17 1710  NA 137  K 3.1*  CL 107    CO2 24  GLUCOSE 108*  BUN 9  CREATININE 0.55*  CALCIUM 8.6*   GFR: CrCl cannot be calculated (Unknown ideal weight.). Liver Function Tests: Recent Labs  Lab 11/22/17 1710  AST 2,438*  ALT 3,122*  ALKPHOS 157*  BILITOT 15.7*  PROT 7.1  ALBUMIN 3.1*   Recent Labs  Lab 11/22/17 1710  LIPASE 72*   No results for input(s): AMMONIA in the last 168 hours. Coagulation Profile: Recent Labs  Lab 11/22/17 1710  INR 1.44   Cardiac Enzymes: No results for input(s): CKTOTAL, CKMB, CKMBINDEX, TROPONINI in the last 168 hours. BNP (last 3 results) No results for input(s): PROBNP in the last 8760 hours. HbA1C: No results for input(s): HGBA1C in the last 72 hours. CBG: No results for input(s): GLUCAP in the last 168 hours. Lipid Profile: No results for input(s): CHOL, HDL, LDLCALC, TRIG, CHOLHDL, LDLDIRECT in the last 72 hours. Thyroid Function Tests: No results for input(s): TSH, T4TOTAL, FREET4, T3FREE, THYROIDAB in the last 72 hours. Anemia Panel: No results for input(s): VITAMINB12, FOLATE, FERRITIN, TIBC, IRON, RETICCTPCT in the last 72 hours. Urine analysis:    Component Value Date/Time   COLORURINE AMBER (A) 11/22/2017 1800   APPEARANCEUR HAZY (A) 11/22/2017 1800   LABSPEC 1.023 11/22/2017 1800   PHURINE 6.0 11/22/2017 1800   GLUCOSEU NEGATIVE 11/22/2017 1800   HGBUR NEGATIVE 11/22/2017 1800   BILIRUBINUR MODERATE (A) 11/22/2017 1800   KETONESUR NEGATIVE 11/22/2017 1800   PROTEINUR 30 (A) 11/22/2017 1800   NITRITE NEGATIVE 11/22/2017 1800   LEUKOCYTESUR NEGATIVE 11/22/2017 1800   Sepsis Labs: @LABRCNTIP (procalcitonin:4,lacticidven:4) )No results found for this or any previous visit (from the past 240 hour(s)).   Radiological Exams on Admission: Ct Chest W Contrast  Result Date: 11/22/2017 CLINICAL DATA:  Jaundice. EXAM: CT CHEST, ABDOMEN, AND PELVIS WITH CONTRAST TECHNIQUE: Multidetector CT imaging of the chest, abdomen and pelvis was performed following the  standard protocol during bolus administration of intravenous contrast. CONTRAST:  ISOVUE-300 IOPAMIDOL (ISOVUE-300) INJECTION 61% COMPARISON:  None. FINDINGS: CT CHEST FINDINGS Cardiovascular: Heart size is normal. No pericardial effusion. Mild aortic atherosclerosis. No thoracic aortic aneurysm. Mediastinum/Nodes: No mass or enlarged lymph nodes seen within the mediastinum or perihilar regions. Esophagus appears normal. Trachea and central bronchi are unremarkable. Lungs/Pleura: Mild bibasilar atelectasis/scarring. Lungs otherwise clear. No pleural effusion or pneumothorax. Musculoskeletal: Mild degenerative spondylosis of the thoracic spine. No acute or suspicious osseous finding. CT ABDOMEN PELVIS FINDINGS Hepatobiliary: No focal liver abnormality. Mild fatty infiltration of the liver. Questionable gallbladder wall thickening. There is pericholecystic fluid/edema. No bile duct stone seen. Common bile duct appears upper normal in caliber. Pancreas: Unremarkable. No pancreatic ductal dilatation or surrounding inflammatory changes. Spleen: Normal in size without focal abnormality. Adrenals/Urinary Tract: Adrenal glands  are unremarkable. Kidneys are normal, without renal calculi, focal lesion, or hydronephrosis. Bladder is unremarkable. Stomach/Bowel: No dilated large or small bowel loops. Vascular/Lymphatic: No acute appearing vascular abnormality. Mild aortic atherosclerosis. Several enlarged lymph nodes are seen in the RIGHT upper quadrant, including a 15 mm short axis lymph node in the portacaval space (series 2, image 66), likely reactive in nature. Reproductive: Prostate gland is moderately enlarged causing slight mass effect on the bladder base. Other: Small amount of free fluid in the lower pelvis. No abscess collection identified. No free intraperitoneal air. Musculoskeletal: No acute or suspicious osseous finding. Degenerative spondylosis of the lumbar spine, mild to moderate in degree. LEFT inguinal  hernia which contains fat only. IMPRESSION: 1. Pericholecystic fluid/edema and questionable gallbladder wall thickening, highly suspicious for acute cholecystitis. Recommend confirmation with RIGHT upper quadrant ultrasound. 2. Common bile duct appears to be within normal limits in caliber, perhaps borderline dilated. No bile duct stone identified. More definitive CBD measurement can be obtained on the RIGHT upper quadrant ultrasound recommended above. 3. Mild fatty infiltration of the liver. 4. Enlarged lymph nodes in the RIGHT upper quadrant, likely reactive in nature. 5. No acute findings within the chest. 6. Moderate prostate gland enlargement. Consider correlation with PSA lab values. Electronically Signed   By: Bary Richard M.D.   On: 11/22/2017 21:35   Ct Abdomen Pelvis W Contrast  Result Date: 11/22/2017 CLINICAL DATA:  Jaundice. EXAM: CT CHEST, ABDOMEN, AND PELVIS WITH CONTRAST TECHNIQUE: Multidetector CT imaging of the chest, abdomen and pelvis was performed following the standard protocol during bolus administration of intravenous contrast. CONTRAST:  ISOVUE-300 IOPAMIDOL (ISOVUE-300) INJECTION 61% COMPARISON:  None. FINDINGS: CT CHEST FINDINGS Cardiovascular: Heart size is normal. No pericardial effusion. Mild aortic atherosclerosis. No thoracic aortic aneurysm. Mediastinum/Nodes: No mass or enlarged lymph nodes seen within the mediastinum or perihilar regions. Esophagus appears normal. Trachea and central bronchi are unremarkable. Lungs/Pleura: Mild bibasilar atelectasis/scarring. Lungs otherwise clear. No pleural effusion or pneumothorax. Musculoskeletal: Mild degenerative spondylosis of the thoracic spine. No acute or suspicious osseous finding. CT ABDOMEN PELVIS FINDINGS Hepatobiliary: No focal liver abnormality. Mild fatty infiltration of the liver. Questionable gallbladder wall thickening. There is pericholecystic fluid/edema. No bile duct stone seen. Common bile duct appears upper  normal in caliber. Pancreas: Unremarkable. No pancreatic ductal dilatation or surrounding inflammatory changes. Spleen: Normal in size without focal abnormality. Adrenals/Urinary Tract: Adrenal glands are unremarkable. Kidneys are normal, without renal calculi, focal lesion, or hydronephrosis. Bladder is unremarkable. Stomach/Bowel: No dilated large or small bowel loops. Vascular/Lymphatic: No acute appearing vascular abnormality. Mild aortic atherosclerosis. Several enlarged lymph nodes are seen in the RIGHT upper quadrant, including a 15 mm short axis lymph node in the portacaval space (series 2, image 66), likely reactive in nature. Reproductive: Prostate gland is moderately enlarged causing slight mass effect on the bladder base. Other: Small amount of free fluid in the lower pelvis. No abscess collection identified. No free intraperitoneal air. Musculoskeletal: No acute or suspicious osseous finding. Degenerative spondylosis of the lumbar spine, mild to moderate in degree. LEFT inguinal hernia which contains fat only. IMPRESSION: 1. Pericholecystic fluid/edema and questionable gallbladder wall thickening, highly suspicious for acute cholecystitis. Recommend confirmation with RIGHT upper quadrant ultrasound. 2. Common bile duct appears to be within normal limits in caliber, perhaps borderline dilated. No bile duct stone identified. More definitive CBD measurement can be obtained on the RIGHT upper quadrant ultrasound recommended above. 3. Mild fatty infiltration of the liver. 4. Enlarged lymph nodes in the  RIGHT upper quadrant, likely reactive in nature. 5. No acute findings within the chest. 6. Moderate prostate gland enlargement. Consider correlation with PSA lab values. Electronically Signed   By: Bary Richard M.D.   On: 11/22/2017 21:35     Assessment/Plan Principal Problem:   Acute hepatitis Active Problems:   Chronic back pain    1. Acute hepatitis/possible hepatic failure -ER physician and  discussed with Dr. Matthias Hughs on-call gastroenterologist who at this time advised observation with trending liver function test.  Tylenol level is negative.  And gastroenterologist as advised not to start any Mucomyst given the negative Tylenol level and normal mental status and normal INR.  I have ordered acute hepatitis panel.  Follow LFTs and INR.  Avoid Tylenol.  Discussed with pharmacy about continue other medications which are okay. 2. Possible cholecystitis per the CAT scan.  Appreciate general surgery consult.  At this time general surgery is recommended work-up for his acute hepatitis. 3. History of depression on sertraline and mirtazapine. 4. Chronic low back pain will place patient on Dilaudid.   DVT prophylaxis: SCDs for now since we are following INR closely due to hepatitis. Code Status: Full code. Family Communication: Discussed with patient. Disposition Plan: Home. Consults called: Dr. Matthias Hughs gastroenterologist.  Development worker, international aid. Admission status: Inpatient.   Eduard Clos MD [facility] Pager [phone].  If 7PM-7AM, please contact night-coverage [email or web address] Password [email or web address]  11/23/2017, 1:52 AM

## 2017-11-23 NOTE — Progress Notes (Signed)
A call from April, RN, CM, [place], to leave information on pt. Pt transferred from [facility] by Izola Price, NP. CSW [place] [phone] ext [phone], pager [phone].

## 2017-11-23 NOTE — ED Notes (Signed)
ED TO INPATIENT HANDOFF REPORT  Name/Age/Gender Peter Lara 72 y.o. male  Code Status   Home/SNF/Other Home  Chief Complaint abnormal labs sent from [place]  Level of Care/Admitting Diagnosis ED Disposition    ED Disposition Condition [place]: [place] [[id number]]  Level of Care: Telemetry [5]  Admit to tele based on following criteria: Monitor for Ischemic changes  Diagnosis: Acute hepatic failure [366294]  Admitting Physician: Rise Patience [phone]  Attending Physician: Rise Patience [phone]  Estimated length of stay: past midnight tomorrow  Certification:: I certify this patient will need inpatient services for at least 2 midnights  PT Class (Do Not Modify): Inpatient [101]  PT Acc Code (Do Not Modify): Private [1]       Medical History History reviewed. No pertinent past medical history.  Allergies No Known Allergies  IV Location/Drains/Wounds Patient Lines/Drains/Airways Status   Active Line/Drains/Airways    Name:   Placement date:   Placement time:   Site:   Days:   Peripheral IV 11/22/17 Left Antecubital   11/22/17    1814    Antecubital   1          Labs/Imaging Results for orders placed or performed during the hospital encounter of 11/22/17 (from the past 48 hour(s))  Lipase, blood     Status: Abnormal   Collection Time: 11/22/17  5:10 PM  Result Value Ref Range   Lipase 72 (H) 11 - 51 U/L    Comment: Performed at [facility], [place] [address] [place], [place] [address]  Comprehensive metabolic panel     Status: Abnormal   Collection Time: 11/22/17  5:10 PM  Result Value Ref Range   Sodium 137 135 - 145 mmol/L   Potassium 3.1 (L) 3.5 - 5.1 mmol/L   Chloride 107 98 - 111 mmol/L   CO2 24 22 - 32 mmol/L   Glucose, Bld 108 (H) 70 - 99 mg/dL   BUN 9 8 - 23 mg/dL   Creatinine, Ser 0.55 (L) 0.61 - 1.24 mg/dL   Calcium 8.6 (L) 8.9 - 10.3 mg/dL   Total Protein 7.1 6.5 - 8.1 g/dL    Albumin 3.1 (L) 3.5 - 5.0 g/dL   AST 2,438 (H) 15 - 41 U/L    Comment: RESULTS CONFIRMED BY MANUAL DILUTION   ALT 3,122 (H) 0 - 44 U/L    Comment: RESULTS CONFIRMED BY MANUAL DILUTION   Alkaline Phosphatase 157 (H) 38 - 126 U/L   Total Bilirubin 15.7 (H) 0.3 - 1.2 mg/dL   GFR calc non Af Amer >60 >60 mL/min   GFR calc Af Amer >60 >60 mL/min    Comment: (NOTE) The eGFR has been calculated using the CKD EPI equation. This calculation has not been validated in all clinical situations. eGFR's persistently <60 mL/min signify possible Chronic Kidney Disease.    Anion gap 6 5 - 15    Comment: Performed at [facility], [place] [address] [place], [place] [address]  CBC     Status: Abnormal   Collection Time: 11/22/17  5:10 PM  Result Value Ref Range   WBC 7.0 4.0 - 10.5 K/uL   RBC 4.29 4.22 - 5.81 MIL/uL   Hemoglobin 13.2 13.0 - 17.0 g/dL   HCT 38.6 (L) 39.0 - 52.0 %   MCV 90.0 80.0 - 100.0 fL   MCH 30.8 26.0 - 34.0 pg   MCHC 34.2 30.0 - 36.0 g/dL  RDW 16.3 (H) 11.5 - 15.5 %   Platelets 138 (L) 150 - 400 K/uL    Comment: REPEATED TO VERIFY   nRBC 0.3 (H) 0.0 - 0.2 %    Comment: Performed at [facility], [place] [address] [place], [place] [address]  Protime-INR     Status: Abnormal   Collection Time: 11/22/17  5:10 PM  Result Value Ref Range   Prothrombin Time 17.3 (H) 11.4 - 15.2 seconds   INR 1.44     Comment: Performed at [facility] [place] [address] [place], [place] [address]  Urinalysis, Routine w reflex microscopic     Status: Abnormal   Collection Time: 11/22/17  6:00 PM  Result Value Ref Range   Color, Urine AMBER (A) YELLOW    Comment: BIOCHEMICALS MAY BE AFFECTED BY COLOR   APPearance HAZY (A) CLEAR   Specific Gravity, Urine 1.023 1.005 - 1.030   pH 6.0 5.0 - 8.0   Glucose, UA NEGATIVE NEGATIVE mg/dL   Hgb urine dipstick NEGATIVE NEGATIVE   Bilirubin Urine MODERATE (A) NEGATIVE   Ketones, ur NEGATIVE NEGATIVE  mg/dL   Protein, ur 30 (A) NEGATIVE mg/dL   Nitrite NEGATIVE NEGATIVE   Leukocytes, UA NEGATIVE NEGATIVE   RBC / HPF 21-50 0 - 5 RBC/hpf   WBC, UA 6-10 0 - 5 WBC/hpf   Bacteria, UA RARE (A) NONE SEEN   Squamous Epithelial / LPF 0-5 0 - 5   Mucus PRESENT    Amorphous Crystal PRESENT     Comment: Performed at [facility], [place] [address], [place], [place] [address]  Acetaminophen level     Status: Abnormal   Collection Time: 11/22/17 10:26 PM  Result Value Ref Range   Acetaminophen (Tylenol), Serum <10 (L) 10 - 30 ug/mL    Comment: (NOTE) Therapeutic concentrations vary significantly. A range of 10-30 ug/mL  may be an effective concentration for many patients. However, some  are best treated at concentrations outside of this range. Acetaminophen concentrations >150 ug/mL at 4 hours after ingestion  and >50 ug/mL at 12 hours after ingestion are often associated with  toxic reactions. Performed at [facility], [place], [place] [address] [place], [place] [address]    Ct Chest W Contrast  Result Date: 11/22/2017 CLINICAL DATA:  Jaundice. EXAM: CT CHEST, ABDOMEN, AND PELVIS WITH CONTRAST TECHNIQUE: Multidetector CT imaging of the chest, abdomen and pelvis was performed following the standard protocol during bolus administration of intravenous contrast. CONTRAST:  144m ISOVUE-300 IOPAMIDOL (ISOVUE-300) INJECTION 61% COMPARISON:  None. FINDINGS: CT CHEST FINDINGS Cardiovascular: Heart size is normal. No pericardial effusion. Mild aortic atherosclerosis. No thoracic aortic aneurysm. Mediastinum/Nodes: No mass or enlarged lymph nodes seen within the mediastinum or perihilar regions. Esophagus appears normal. Trachea and central bronchi are unremarkable. Lungs/Pleura: Mild bibasilar atelectasis/scarring. Lungs otherwise clear. No pleural effusion or pneumothorax. Musculoskeletal: Mild degenerative spondylosis of the thoracic spine. No acute or suspicious osseous finding.  CT ABDOMEN PELVIS FINDINGS Hepatobiliary: No focal liver abnormality. Mild fatty infiltration of the liver. Questionable gallbladder wall thickening. There is pericholecystic fluid/edema. No bile duct stone seen. Common bile duct appears upper normal in caliber. Pancreas: Unremarkable. No pancreatic ductal dilatation or surrounding inflammatory changes. Spleen: Normal in size without focal abnormality. Adrenals/Urinary Tract: Adrenal glands are unremarkable. Kidneys are normal, without renal calculi, focal lesion, or hydronephrosis. Bladder is unremarkable. Stomach/Bowel: No dilated large or small bowel loops. Vascular/Lymphatic: No acute appearing vascular abnormality. Mild aortic atherosclerosis. Several enlarged  lymph nodes are seen in the RIGHT upper quadrant, including a 15 mm short axis lymph node in the portacaval space (series 2, image 66), likely reactive in nature. Reproductive: Prostate gland is moderately enlarged causing slight mass effect on the bladder base. Other: Small amount of free fluid in the lower pelvis. No abscess collection identified. No free intraperitoneal air. Musculoskeletal: No acute or suspicious osseous finding. Degenerative spondylosis of the lumbar spine, mild to moderate in degree. LEFT inguinal hernia which contains fat only. IMPRESSION: 1. Pericholecystic fluid/edema and questionable gallbladder wall thickening, highly suspicious for acute cholecystitis. Recommend confirmation with RIGHT upper quadrant ultrasound. 2. Common bile duct appears to be within normal limits in caliber, perhaps borderline dilated. No bile duct stone identified. More definitive CBD measurement can be obtained on the RIGHT upper quadrant ultrasound recommended above. 3. Mild fatty infiltration of the liver. 4. Enlarged lymph nodes in the RIGHT upper quadrant, likely reactive in nature. 5. No acute findings within the chest. 6. Moderate prostate gland enlargement. Consider correlation with PSA lab values.  Electronically Signed   By: Franki Cabot M.D.   On: 11/22/2017 21:35   Ct Abdomen Pelvis W Contrast  Result Date: 11/22/2017 CLINICAL DATA:  Jaundice. EXAM: CT CHEST, ABDOMEN, AND PELVIS WITH CONTRAST TECHNIQUE: Multidetector CT imaging of the chest, abdomen and pelvis was performed following the standard protocol during bolus administration of intravenous contrast. CONTRAST:  159m ISOVUE-300 IOPAMIDOL (ISOVUE-300) INJECTION 61% COMPARISON:  None. FINDINGS: CT CHEST FINDINGS Cardiovascular: Heart size is normal. No pericardial effusion. Mild aortic atherosclerosis. No thoracic aortic aneurysm. Mediastinum/Nodes: No mass or enlarged lymph nodes seen within the mediastinum or perihilar regions. Esophagus appears normal. Trachea and central bronchi are unremarkable. Lungs/Pleura: Mild bibasilar atelectasis/scarring. Lungs otherwise clear. No pleural effusion or pneumothorax. Musculoskeletal: Mild degenerative spondylosis of the thoracic spine. No acute or suspicious osseous finding. CT ABDOMEN PELVIS FINDINGS Hepatobiliary: No focal liver abnormality. Mild fatty infiltration of the liver. Questionable gallbladder wall thickening. There is pericholecystic fluid/edema. No bile duct stone seen. Common bile duct appears upper normal in caliber. Pancreas: Unremarkable. No pancreatic ductal dilatation or surrounding inflammatory changes. Spleen: Normal in size without focal abnormality. Adrenals/Urinary Tract: Adrenal glands are unremarkable. Kidneys are normal, without renal calculi, focal lesion, or hydronephrosis. Bladder is unremarkable. Stomach/Bowel: No dilated large or small bowel loops. Vascular/Lymphatic: No acute appearing vascular abnormality. Mild aortic atherosclerosis. Several enlarged lymph nodes are seen in the RIGHT upper quadrant, including a 15 mm short axis lymph node in the portacaval space (series 2, image 66), likely reactive in nature. Reproductive: Prostate gland is moderately enlarged causing  slight mass effect on the bladder base. Other: Small amount of free fluid in the lower pelvis. No abscess collection identified. No free intraperitoneal air. Musculoskeletal: No acute or suspicious osseous finding. Degenerative spondylosis of the lumbar spine, mild to moderate in degree. LEFT inguinal hernia which contains fat only. IMPRESSION: 1. Pericholecystic fluid/edema and questionable gallbladder wall thickening, highly suspicious for acute cholecystitis. Recommend confirmation with RIGHT upper quadrant ultrasound. 2. Common bile duct appears to be within normal limits in caliber, perhaps borderline dilated. No bile duct stone identified. More definitive CBD measurement can be obtained on the RIGHT upper quadrant ultrasound recommended above. 3. Mild fatty infiltration of the liver. 4. Enlarged lymph nodes in the RIGHT upper quadrant, likely reactive in nature. 5. No acute findings within the chest. 6. Moderate prostate gland enlargement. Consider correlation with PSA lab values. Electronically Signed   By: SRoxy HorsemanD.  On: 11/22/2017 21:35   None  Pending Labs Unresulted Labs (From admission, onward)   None      Vitals/Pain Today's Vitals   11/22/17 2016 11/22/17 2033 11/22/17 2226 11/23/17 0045  BP:   138/80 (!) 141/75  Pulse:   67 65  Resp:   14 15  Temp:      SpO2:   96% 94%  PainSc: 7  2  3       Isolation Precautions No active isolations  Medications Medications  sodium chloride 0.9 % injection (has no administration in time range)  HYDROmorphone (DILAUDID) injection 1 mg (1 mg Intravenous Given 11/22/17 2013)  ondansetron (ZOFRAN) injection 4 mg (4 mg Intravenous Given 11/22/17 2023)  iopamidol (ISOVUE-300) 61 % injection 100 mL (100 mLs Intravenous Contrast Given 11/22/17 2112)    Mobility walks

## 2017-11-23 NOTE — ED Notes (Signed)
Attempted to call report. RN unavailable 

## 2017-11-23 NOTE — Progress Notes (Signed)
    CC: Acute jaundice  Subjective: Patient has lower abdominal pain on the right side.  He is not really overly tender over the liver, at least no more than the lower abdomen.  Objective: Vital signs in last 24 hours: Temp:  [98.2 F (36.8 C)-98.9 F (37.2 C)] 98.9 F (37.2 C) (10/22 0501) Pulse Rate:  [61-72] 62 (10/22 0501) Resp:  [13-17] 16 (10/22 0501) BP: (138-157)/(75-89) 152/84 (10/22 0501) SpO2:  [94 %-100 %] 94 % (10/22 0501)  425 IV Voided X3 Afebrile vital signs are stable CMP Latest Ref Rng & Units 11/23/2017 11/22/2017  Total Protein 6.5 - 8.1 g/dL 6.7 7.1  Total Bilirubin 0.3 - 1.2 mg/dL 15.4(H) 15.7(H)  Alkaline Phos 38 - 126 U/L 154(H) 157(H)  AST 15 - 41 U/L 2,252(H) 2,438(H)  ALT 0 - 44 U/L 2,901(H) 3,122(H)   LDH this a.m. - 501  INR 1.44 Tylenol level less than 10 Urinalysis shows 6-10 white cells/21-50 red cells CT of the abdomen/pelvis with contrast:Pericholecystic fluid/edema and questionable gallbladder wall thickening, highly suspicious for acute cholecystitis.  Normal common bile duct no bile duct stones identified. CT of the chest: No acute findings.  Intake/Output from previous day: 10/21 0701 - 10/22 0700 In: 425 [I.V.:425] Out: -  Intake/Output this shift: No intake/output data recorded.  General appearance: alert, cooperative and no distress GI: Soft, tender on the right side positive bowel sounds no distention.  Lab Results:  Recent Labs    11/22/17 1710 11/23/17 0522  WBC 7.0 6.4  HGB 13.2 13.4  HCT 38.6* 38.9*  PLT 138* 129*    BMET Recent Labs    11/22/17 1710 11/23/17 0522  NA 137 138  K 3.1* 3.2*  CL 107 107  CO2 24 25  GLUCOSE 108* 90  BUN 9 10  CREATININE 0.55* 0.45*  CALCIUM 8.6* 8.7*   PT/INR Recent Labs    11/22/17 1710  LABPROT 17.3*  INR 1.44    Recent Labs  Lab 11/22/17 1710 11/23/17 0522  AST 2,438* 2,252*  ALT 3,122* 2,901*  ALKPHOS 157* 154*  BILITOT 15.7* 15.4*  PROT 7.1 6.7   ALBUMIN 3.1* 2.9*     Lipase     Component Value Date/Time   LIPASE 72 (H) 11/22/2017 1710     Medications: . gabapentin  600 mg Oral TID  . mirtazapine  15 mg Oral QHS  . potassium chloride  40 mEq Oral Once  . sertraline  75 mg Oral Daily  . sodium chloride       . 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 Stefan Church is going to get a right upper quadrant ultrasound, but everyone is more concerned about hepatitis/liver failure than cholecystitis.  We will follow with you for now.  GI has been contacted and is going to see him today also.       LOS: 1 day    Emira Eubanks 11/23/2017 [phone]

## 2017-11-23 NOTE — Consult Note (Signed)
[facility] Gastroenterology Consultation Note  Referring Provider: No ref. provider found Primary Care Physician:  Patient, No Pcp Per Primary Gastroenterologist:  Dr.  Jaquita Rector for Consultation:  Elevated liver enzymes  HPI: Peter Lara is a 72 y.o. male with jaundice.  Started over one week ago.  No prior liver problems.  No alcohol.  Used to take 3-4 Vicodin (</2 grams acetaminophen per day) daily, now switched to hydromorphone.  Has jaundice; dark urine; light stools.  Imaging showed gallbladder wall thickening and pericholecystic fluidbut no clear biliary obstruction.  Has some crampy lower abdominal pains; no upper abdominal pains or fevers.  No weight loss.  No blood in stool.  No nausea, vomiting, diarrhea.  No recent travel.  No recent antibiotics or recent new meds or recent change in medication doses (gabapentin dose increased about one year ago).  No sick contacts.  No risk factors for viral hepatitis.   History reviewed. No pertinent past medical history.  History reviewed. No pertinent surgical history.  Prior to Admission medications   Medication Sig Start Date End Date Taking? Authorizing Provider  Carboxymethylcellulose Sodium (EYE DROPS OP) Apply 1 drop to eye daily as needed (dry eyes).   Yes [provider]  gabapentin (NEURONTIN) 600 MG tablet Take 600 mg by mouth 3 (three) times daily.    Yes [provider]  HYDROcodone-acetaminophen (NORCO) 7.5-325 MG tablet Take 1 tablet by mouth every 6 (six) hours as needed for pain.   Yes [provider]  meloxicam (MOBIC) 15 MG tablet Take 15 mg by mouth daily.   Yes [provider]  mirtazapine (REMERON) 15 MG tablet Take 15 mg by mouth at bedtime.   Yes [provider]  sertraline (ZOLOFT) 50 MG tablet Take 75 mg by mouth daily.    Yes [provider]    Current Facility-Administered Medications  Medication Dose Route Frequency Provider Last Rate Last Dose  . 0.9 %  sodium  chloride infusion   Intravenous Continuous Eduard Clos, MD 75 mL/hr at 11/23/17 0206    . gabapentin (NEURONTIN) capsule 600 mg  600 mg Oral TID Eduard Clos, MD   600 mg at 11/23/17 1610  . HYDROmorphone (DILAUDID) injection 1 mg  1 mg Intravenous Q3H PRN Eduard Clos, MD      . mirtazapine (REMERON) tablet 15 mg  15 mg Oral QHS Eduard Clos, MD   15 mg at 11/22/17 0207  . ondansetron (ZOFRAN) tablet 4 mg  4 mg Oral Q6H PRN Eduard Clos, MD       Or  . ondansetron [facility]) injection 4 mg  4 mg Intravenous Q6H PRN Eduard Clos, MD        Allergies as of 11/22/2017  . (No Known Allergies)    Family History  Problem Relation Age of Onset  . CAD Father   . Diabetes Mellitus II Brother     Social History   Socioeconomic History  . Marital status: Single    Spouse name: Not on file  . Number of children: Not on file  . Years of education: Not on file  . Highest education level: Not on file  Occupational History  . Not on file  Social Needs  . Financial resource strain: Not on file  . Food insecurity:    Worry: Not on file    Inability: Not on file  . Transportation needs:    Medical: Not on file    Non-medical: Not on  file  Tobacco Use  . Smoking status: Never Smoker  . Smokeless tobacco: Never Used  Substance and Sexual Activity  . Alcohol use: Not Currently  . Drug use: Not on file  . Sexual activity: Not on file  Lifestyle  . Physical activity:    Days per week: Not on file    Minutes per session: Not on file  . Stress: Not on file  Relationships  . Social connections:    Talks on phone: Not on file    Gets together: Not on file    Attends religious service: Not on file    Active member of club or organization: Not on file    Attends meetings of clubs or organizations: Not on file    Relationship status: Not on file  . Intimate partner violence:    Fear of current or ex partner: Not on file    Emotionally  abused: Not on file    Physically abused: Not on file    Forced sexual activity: Not on file  Other Topics Concern  . Not on file  Social History Narrative  . Not on file    Review of Systems: As per HPI, all others negative  Physical Exam: Vital signs in last 24 hours: Temp:  [98.2 F (36.8 C)-98.9 F (37.2 C)] 98.9 F (37.2 C) (10/22 0501) Pulse Rate:  [61-72] 62 (10/22 0501) Resp:  [13-17] 16 (10/22 0501) BP: (138-157)/(75-89) 152/84 (10/22 0501) SpO2:  [94 %-100 %] 94 % (10/22 0501) Last BM Date: 11/23/17 General:   Alert,  Well-developed, well-nourished, pleasant and cooperative in NAD Head:  Normocephalic and atraumatic. Eyes:  Sclera icteric bilaterally   Conjunctiva pink. Ears:  Normal auditory acuity. Nose:  No deformity, discharge,  or lesions. Mouth:  No deformity or lesions.  Oropharynx pink & moist. Neck:  Supple; no masses or thyromegaly. Lungs:  Clear throughout to auscultation.   No wheezes, crackles, or rhonchi. No acute distress. Heart:  Regular rate and rhythm; no murmurs, clicks, rubs,  or gallops. Abdomen:  Soft, nontender and nondistended. No masses, hepatosplenomegaly or hernias noted. Normal bowel sounds, without guarding, and without rebound.     Msk:  Symmetrical without gross deformities. Normal posture. Pulses:  Normal pulses noted. Extremities:  Without clubbing or edema. Neurologic:  Alert and  oriented x4;  grossly normal neurologically. Skin:  Jaundiced, several excoriated skin lesions; evidence significant sun exposure; otherwise intact without significant lesions or rashes. Psych:  Alert and cooperative. Normal mood and affect.   Lab Results: Recent Labs    11/22/17 1710 11/23/17 0522  WBC 7.0 6.4  HGB 13.2 13.4  HCT 38.6* 38.9*  PLT 138* 129*   BMET Recent Labs    11/22/17 1710 11/23/17 0522  NA 137 138  K 3.1* 3.2*  CL 107 107  CO2 24 25  GLUCOSE 108* 90  BUN 9 10  CREATININE 0.55* 0.45*  CALCIUM 8.6* 8.7*    LFT Recent Labs    11/23/17 0522  PROT 6.7  ALBUMIN 2.9*  AST 2,252*  ALT 2,901*  ALKPHOS 154*  BILITOT 15.4*  BILIDIR 9.3*  IBILI 6.1*   PT/INR Recent Labs    11/22/17 1710  LABPROT 17.3*  INR 1.44    Studies/Results: Ct Chest W Contrast  Result Date: 11/22/2017 CLINICAL DATA:  Jaundice. EXAM: CT CHEST, ABDOMEN, AND PELVIS WITH CONTRAST TECHNIQUE: Multidetector CT imaging of the chest, abdomen and pelvis was performed following the standard protocol during bolus administration of intravenous contrast.  CONTRAST:  ISOVUE-300 IOPAMIDOL (ISOVUE-300) INJECTION 61% COMPARISON:  None. FINDINGS: CT CHEST FINDINGS Cardiovascular: Heart size is normal. No pericardial effusion. Mild aortic atherosclerosis. No thoracic aortic aneurysm. Mediastinum/Nodes: No mass or enlarged lymph nodes seen within the mediastinum or perihilar regions. Esophagus appears normal. Trachea and central bronchi are unremarkable. Lungs/Pleura: Mild bibasilar atelectasis/scarring. Lungs otherwise clear. No pleural effusion or pneumothorax. Musculoskeletal: Mild degenerative spondylosis of the thoracic spine. No acute or suspicious osseous finding. CT ABDOMEN PELVIS FINDINGS Hepatobiliary: No focal liver abnormality. Mild fatty infiltration of the liver. Questionable gallbladder wall thickening. There is pericholecystic fluid/edema. No bile duct stone seen. Common bile duct appears upper normal in caliber. Pancreas: Unremarkable. No pancreatic ductal dilatation or surrounding inflammatory changes. Spleen: Normal in size without focal abnormality. Adrenals/Urinary Tract: Adrenal glands are unremarkable. Kidneys are normal, without renal calculi, focal lesion, or hydronephrosis. Bladder is unremarkable. Stomach/Bowel: No dilated large or small bowel loops. Vascular/Lymphatic: No acute appearing vascular abnormality. Mild aortic atherosclerosis. Several enlarged lymph nodes are seen in the RIGHT upper quadrant, including  a 15 mm short axis lymph node in the portacaval space (series 2, image 66), likely reactive in nature. Reproductive: Prostate gland is moderately enlarged causing slight mass effect on the bladder base. Other: Small amount of free fluid in the lower pelvis. No abscess collection identified. No free intraperitoneal air. Musculoskeletal: No acute or suspicious osseous finding. Degenerative spondylosis of the lumbar spine, mild to moderate in degree. LEFT inguinal hernia which contains fat only. IMPRESSION: 1. Pericholecystic fluid/edema and questionable gallbladder wall thickening, highly suspicious for acute cholecystitis. Recommend confirmation with RIGHT upper quadrant ultrasound. 2. Common bile duct appears to be within normal limits in caliber, perhaps borderline dilated. No bile duct stone identified. More definitive CBD measurement can be obtained on the RIGHT upper quadrant ultrasound recommended above. 3. Mild fatty infiltration of the liver. 4. Enlarged lymph nodes in the RIGHT upper quadrant, likely reactive in nature. 5. No acute findings within the chest. 6. Moderate prostate gland enlargement. Consider correlation with PSA lab values. Electronically Signed   By: Bary Richard M.D.   On: 11/22/2017 21:35   Ct Abdomen Pelvis W Contrast  Result Date: 11/22/2017 CLINICAL DATA:  Jaundice. EXAM: CT CHEST, ABDOMEN, AND PELVIS WITH CONTRAST TECHNIQUE: Multidetector CT imaging of the chest, abdomen and pelvis was performed following the standard protocol during bolus administration of intravenous contrast. CONTRAST:  ISOVUE-300 IOPAMIDOL (ISOVUE-300) INJECTION 61% COMPARISON:  None. FINDINGS: CT CHEST FINDINGS Cardiovascular: Heart size is normal. No pericardial effusion. Mild aortic atherosclerosis. No thoracic aortic aneurysm. Mediastinum/Nodes: No mass or enlarged lymph nodes seen within the mediastinum or perihilar regions. Esophagus appears normal. Trachea and central bronchi are unremarkable.  Lungs/Pleura: Mild bibasilar atelectasis/scarring. Lungs otherwise clear. No pleural effusion or pneumothorax. Musculoskeletal: Mild degenerative spondylosis of the thoracic spine. No acute or suspicious osseous finding. CT ABDOMEN PELVIS FINDINGS Hepatobiliary: No focal liver abnormality. Mild fatty infiltration of the liver. Questionable gallbladder wall thickening. There is pericholecystic fluid/edema. No bile duct stone seen. Common bile duct appears upper normal in caliber. Pancreas: Unremarkable. No pancreatic ductal dilatation or surrounding inflammatory changes. Spleen: Normal in size without focal abnormality. Adrenals/Urinary Tract: Adrenal glands are unremarkable. Kidneys are normal, without renal calculi, focal lesion, or hydronephrosis. Bladder is unremarkable. Stomach/Bowel: No dilated large or small bowel loops. Vascular/Lymphatic: No acute appearing vascular abnormality. Mild aortic atherosclerosis. Several enlarged lymph nodes are seen in the RIGHT upper quadrant, including a 15 mm short axis lymph node in the  portacaval space (series 2, image 66), likely reactive in nature. Reproductive: Prostate gland is moderately enlarged causing slight mass effect on the bladder base. Other: Small amount of free fluid in the lower pelvis. No abscess collection identified. No free intraperitoneal air. Musculoskeletal: No acute or suspicious osseous finding. Degenerative spondylosis of the lumbar spine, mild to moderate in degree. LEFT inguinal hernia which contains fat only. IMPRESSION: 1. Pericholecystic fluid/edema and questionable gallbladder wall thickening, highly suspicious for acute cholecystitis. Recommend confirmation with RIGHT upper quadrant ultrasound. 2. Common bile duct appears to be within normal limits in caliber, perhaps borderline dilated. No bile duct stone identified. More definitive CBD measurement can be obtained on the RIGHT upper quadrant ultrasound recommended above. 3. Mild fatty  infiltration of the liver. 4. Enlarged lymph nodes in the RIGHT upper quadrant, likely reactive in nature. 5. No acute findings within the chest. 6. Moderate prostate gland enlargement. Consider correlation with PSA lab values. Electronically Signed   By: Bary Richard M.D.   On: 11/22/2017 21:35    Impression:  1.  Elevated liver enzymes, significant, in hepatocellular pattern, most consistent with acute liver injury.  There is no evidence of acute liver failure (no encephalopathy, system failure, coagulopathy). Unclear origin.  Common causes include viral hepatitis, medication/toxin.  No alcohol.  No clear inciting trigger.  No evidence of biliary obsstruction on imaging. 2.  Gallbladder thickening.  Doubt cholecystitis.  Suspect reactive thickening from significant hepatitis.  Plan:  1.  Agree with acute hepatitis panel. 2.  Other labs:  Autoimmune studies, iron studies, immunoglobulins.   3.  Follow LFTs, INR and mental status.  As long as downtrending and normal INR and mental status, do not think patient needs transfer to tertiary/liver transplant center.   4.  If LFTs remain elevated and lab/serologic work-up is unrevealing, would need to consider liver biopsy. 5.  [facility] will follow.   LOS: 1 day   Monisha Siebel M  11/23/2017, 12:51 PM  Cell [phone] If no answer or after 5 PM call [phone]

## 2017-11-23 NOTE — Progress Notes (Signed)
PROGRESS NOTE                                                                                                                                                                                                             Patient Demographics:    Peter Lara, is a 72 y.o. male, DOB - 1945-08-06, [id number]:[id number]  Admit date - 11/22/2017   Admitting Physician Eduard Clos, MD  Outpatient Primary MD for the patient is Patient, No Pcp Per  LOS - 1  Outpatient Specialists: none  Chief Complaint  Patient presents with  . Abnormal Lab       Brief Narrative   72 year old male with chronic back pain on Vicodin (3-4 tablets a day), switch to Dilaudid on the day of admission by outpatient pain clinic, depression was sent to the ED with jaundice.  Patient also complained of dark urine light stools.  Imaging done in the ED showed thickened gallbladder with pericholecystic fluid but no biliary obstruction.  He did report some lower abdominal cramps but no fevers, chills, nausea, vomiting, hematemesis or melena.  No recent travel or eating outside or new medication.  Denies any sick contacts or history of IV drug use. In the ED he was found to have bilirubin of 15.7 with AST of 2400 and ALT of 3100.  Admitted for further work-up and equal GI consulted.  Tylenol level was negative.   Subjective:   Reports lower abdominal cramping to have improved.  No overnight events.   Assessment  & Plan :    Principal Problem:   Acute transaminitis. Differential includes acute viral hepatitis versus ischemic hepatitis.  Total bilirubin and LFTs slowly trending down.  Check acute viral panel, CMV, EBV, autoimmune hepatitis panel. GI recommends to monitor and trend LFTs and INR closely.  Continue neurochecks.  As long as LFTs trending down and patient asymptomatic no need to transfer to tertiary center. If LFTs unimproved and work-up negative, GI  recommends liver biopsy. Avoid hepatotoxic agents (discontinued Zoloft).  Active Problems:   Chronic back pain Resumed Dilaudid.  Gallbladder thickening on CT?  Acute cholecystitis Right upper quadrant ordered.  Surgery following and recommends medical work-up and unlikely acute surgical issue.    Code Status : Full code  Family Communication  : None at bedside  Disposition Plan  : Home pending clinical  improvement  Barriers For Discharge : Active symptoms  Consults  : [facility], surgery  Procedures  : CT chest and abdomen, right upper quadrant ultrasound  DVT Prophylaxis  : SCDs  Lab Results  Component Value Date   PLT 129 (L) 11/23/2017    Antibiotics  :    Anti-infectives (From admission, onward)   None        Objective:   Vitals:   11/22/17 2226 11/23/17 0045 11/23/17 0124 11/23/17 0501  BP: 138/80 (!) 141/75 (!) 155/86 (!) 152/84  Pulse: 67 65 61 62  Resp: 14 15 16 16   Temp:   98.2 F (36.8 C) 98.9 F (37.2 C)  TempSrc:   Oral Oral  SpO2: 96% 94% 97% 94%    Wt Readings from Last 3 Encounters:  No data found for Wt     Intake/Output Summary (Last 24 hours) at 11/23/2017 1449 Last data filed at 11/23/2017 1314 Gross per 24 hour  Intake 425 ml  Output 300 ml  Net 125 ml     Physical Exam  Gen: not in distress HEENT: no pallor, icterus+++,  moist mucosa, supple neck Chest: clear b/l, no added sounds CVS: N S1&S2, no murmurs, rubs or gallop GI: soft, NT, ND, BS+ Musculoskeletal: warm, no edema CNS: AAOX3, non focal    Data Review:    CBC Recent Labs  Lab 11/22/17 1710 11/23/17 0522  WBC 7.0 6.4  HGB 13.2 13.4  HCT 38.6* 38.9*  PLT 138* 129*  MCV 90.0 88.8  MCH 30.8 30.6  MCHC 34.2 34.4  RDW 16.3* 16.1*  LYMPHSABS  --  1.5  MONOABS  --  1.1*  EOSABS  --  0.1  BASOSABS  --  0.1    Chemistries  Recent Labs  Lab 11/22/17 1710 11/23/17 0522  NA 137 138  K 3.1* 3.2*  CL 107 107  CO2 24 25  GLUCOSE 108* 90  BUN 9 10    CREATININE 0.55* 0.45*  CALCIUM 8.6* 8.7*  AST 2,438* 2,252*  ALT 3,122* 2,901*  ALKPHOS 157* 154*  BILITOT 15.7* 15.4*   ------------------------------------------------------------------------------------------------------------------ No results for input(s): CHOL, HDL, LDLCALC, TRIG, CHOLHDL, LDLDIRECT in the last 72 hours.  No results found for: HGBA1C ------------------------------------------------------------------------------------------------------------------ No results for input(s): TSH, T4TOTAL, T3FREE, THYROIDAB in the last 72 hours.  Invalid input(s): FREET3 ------------------------------------------------------------------------------------------------------------------ No results for input(s): VITAMINB12, FOLATE, FERRITIN, TIBC, IRON, RETICCTPCT in the last 72 hours.  Coagulation profile Recent Labs  Lab 11/22/17 1710  INR 1.44    No results for input(s): DDIMER in the last 72 hours.  Cardiac Enzymes No results for input(s): CKMB, TROPONINI, MYOGLOBIN in the last 168 hours.  Invalid input(s): CK ------------------------------------------------------------------------------------------------------------------ No results found for: BNP  Inpatient Medications  Scheduled Meds: . gabapentin  600 mg Oral TID  . mirtazapine  15 mg Oral QHS   Continuous Infusions: . sodium chloride 75 mL/hr at 11/23/17 0206   PRN Meds:.HYDROmorphone (DILAUDID) injection, ondansetron **OR** ondansetron (ZOFRAN) IV  Micro Results No results found for this or any previous visit (from the past 240 hour(s)).  Radiology Reports Ct Chest W Contrast  Result Date: 11/22/2017 CLINICAL DATA:  Jaundice. EXAM: CT CHEST, ABDOMEN, AND PELVIS WITH CONTRAST TECHNIQUE: Multidetector CT imaging of the chest, abdomen and pelvis was performed following the standard protocol during bolus administration of intravenous contrast. CONTRAST:  ISOVUE-300 IOPAMIDOL (ISOVUE-300) INJECTION 61%  COMPARISON:  None. FINDINGS: CT CHEST FINDINGS Cardiovascular: Heart size is normal. No pericardial effusion. Mild aortic  atherosclerosis. No thoracic aortic aneurysm. Mediastinum/Nodes: No mass or enlarged lymph nodes seen within the mediastinum or perihilar regions. Esophagus appears normal. Trachea and central bronchi are unremarkable. Lungs/Pleura: Mild bibasilar atelectasis/scarring. Lungs otherwise clear. No pleural effusion or pneumothorax. Musculoskeletal: Mild degenerative spondylosis of the thoracic spine. No acute or suspicious osseous finding. CT ABDOMEN PELVIS FINDINGS Hepatobiliary: No focal liver abnormality. Mild fatty infiltration of the liver. Questionable gallbladder wall thickening. There is pericholecystic fluid/edema. No bile duct stone seen. Common bile duct appears upper normal in caliber. Pancreas: Unremarkable. No pancreatic ductal dilatation or surrounding inflammatory changes. Spleen: Normal in size without focal abnormality. Adrenals/Urinary Tract: Adrenal glands are unremarkable. Kidneys are normal, without renal calculi, focal lesion, or hydronephrosis. Bladder is unremarkable. Stomach/Bowel: No dilated large or small bowel loops. Vascular/Lymphatic: No acute appearing vascular abnormality. Mild aortic atherosclerosis. Several enlarged lymph nodes are seen in the RIGHT upper quadrant, including a 15 mm short axis lymph node in the portacaval space (series 2, image 66), likely reactive in nature. Reproductive: Prostate gland is moderately enlarged causing slight mass effect on the bladder base. Other: Small amount of free fluid in the lower pelvis. No abscess collection identified. No free intraperitoneal air. Musculoskeletal: No acute or suspicious osseous finding. Degenerative spondylosis of the lumbar spine, mild to moderate in degree. LEFT inguinal hernia which contains fat only. IMPRESSION: 1. Pericholecystic fluid/edema and questionable gallbladder wall thickening, highly  suspicious for acute cholecystitis. Recommend confirmation with RIGHT upper quadrant ultrasound. 2. Common bile duct appears to be within normal limits in caliber, perhaps borderline dilated. No bile duct stone identified. More definitive CBD measurement can be obtained on the RIGHT upper quadrant ultrasound recommended above. 3. Mild fatty infiltration of the liver. 4. Enlarged lymph nodes in the RIGHT upper quadrant, likely reactive in nature. 5. No acute findings within the chest. 6. Moderate prostate gland enlargement. Consider correlation with PSA lab values. Electronically Signed   By: Bary Richard M.D.   On: 11/22/2017 21:35   Ct Abdomen Pelvis W Contrast  Result Date: 11/22/2017 CLINICAL DATA:  Jaundice. EXAM: CT CHEST, ABDOMEN, AND PELVIS WITH CONTRAST TECHNIQUE: Multidetector CT imaging of the chest, abdomen and pelvis was performed following the standard protocol during bolus administration of intravenous contrast. CONTRAST:  ISOVUE-300 IOPAMIDOL (ISOVUE-300) INJECTION 61% COMPARISON:  None. FINDINGS: CT CHEST FINDINGS Cardiovascular: Heart size is normal. No pericardial effusion. Mild aortic atherosclerosis. No thoracic aortic aneurysm. Mediastinum/Nodes: No mass or enlarged lymph nodes seen within the mediastinum or perihilar regions. Esophagus appears normal. Trachea and central bronchi are unremarkable. Lungs/Pleura: Mild bibasilar atelectasis/scarring. Lungs otherwise clear. No pleural effusion or pneumothorax. Musculoskeletal: Mild degenerative spondylosis of the thoracic spine. No acute or suspicious osseous finding. CT ABDOMEN PELVIS FINDINGS Hepatobiliary: No focal liver abnormality. Mild fatty infiltration of the liver. Questionable gallbladder wall thickening. There is pericholecystic fluid/edema. No bile duct stone seen. Common bile duct appears upper normal in caliber. Pancreas: Unremarkable. No pancreatic ductal dilatation or surrounding inflammatory changes. Spleen: Normal in  size without focal abnormality. Adrenals/Urinary Tract: Adrenal glands are unremarkable. Kidneys are normal, without renal calculi, focal lesion, or hydronephrosis. Bladder is unremarkable. Stomach/Bowel: No dilated large or small bowel loops. Vascular/Lymphatic: No acute appearing vascular abnormality. Mild aortic atherosclerosis. Several enlarged lymph nodes are seen in the RIGHT upper quadrant, including a 15 mm short axis lymph node in the portacaval space (series 2, image 66), likely reactive in nature. Reproductive: Prostate gland is moderately enlarged causing slight mass effect on the bladder base. Other:  Small amount of free fluid in the lower pelvis. No abscess collection identified. No free intraperitoneal air. Musculoskeletal: No acute or suspicious osseous finding. Degenerative spondylosis of the lumbar spine, mild to moderate in degree. LEFT inguinal hernia which contains fat only. IMPRESSION: 1. Pericholecystic fluid/edema and questionable gallbladder wall thickening, highly suspicious for acute cholecystitis. Recommend confirmation with RIGHT upper quadrant ultrasound. 2. Common bile duct appears to be within normal limits in caliber, perhaps borderline dilated. No bile duct stone identified. More definitive CBD measurement can be obtained on the RIGHT upper quadrant ultrasound recommended above. 3. Mild fatty infiltration of the liver. 4. Enlarged lymph nodes in the RIGHT upper quadrant, likely reactive in nature. 5. No acute findings within the chest. 6. Moderate prostate gland enlargement. Consider correlation with PSA lab values. Electronically Signed   By: Bary Richard M.D.   On: 11/22/2017 21:35    Time Spent in minutes  25   Alysabeth Scalia M.D on 11/23/2017 at 2:49 PM  Between 7am to 7pm - Pager - [phone]  After 7pm go to [email or web address] - password [email or web address]  [facility] -  Office  [phone]

## 2017-11-24 DIAGNOSIS — R740 Nonspecific elevation of levels of transaminase and lactic acid dehydrogenase [LDH]: Secondary | ICD-10-CM

## 2017-11-24 DIAGNOSIS — G8929 Other chronic pain: Secondary | ICD-10-CM

## 2017-11-24 DIAGNOSIS — B179 Acute viral hepatitis, unspecified: Secondary | ICD-10-CM

## 2017-11-24 DIAGNOSIS — M545 Low back pain: Secondary | ICD-10-CM

## 2017-11-24 LAB — COMPREHENSIVE METABOLIC PANEL
ALT: 2291 U/L — AB (ref 0–44)
AST: 1700 U/L — AB (ref 15–41)
Albumin: 2.9 g/dL — ABNORMAL LOW (ref 3.5–5.0)
Alkaline Phosphatase: 151 U/L — ABNORMAL HIGH (ref 38–126)
Anion gap: 9 (ref 5–15)
BUN: 10 mg/dL (ref 8–23)
CHLORIDE: 104 mmol/L (ref 98–111)
CO2: 28 mmol/L (ref 22–32)
CREATININE: 0.63 mg/dL (ref 0.61–1.24)
Calcium: 8.5 mg/dL — ABNORMAL LOW (ref 8.9–10.3)
GFR calc Af Amer: >60 mL/min (ref >60)
GFR calc non Af Amer: >60 mL/min (ref >60)
GLUCOSE: 97 mg/dL (ref 70–99)
Potassium: 3.6 mmol/L (ref 3.5–5.1)
SODIUM: 141 mmol/L (ref 135–145)
Total Bilirubin: 13.4 mg/dL — ABNORMAL HIGH (ref 0.3–1.2)
Total Protein: 7 g/dL (ref 6.5–8.1)

## 2017-11-24 LAB — PROTIME-INR
INR: 1.31
Prothrombin Time: 16.2 seconds — ABNORMAL HIGH (ref 11.4–15.2)

## 2017-11-24 LAB — CBC
HCT: 39.1 % (ref 39.0–52.0)
HEMOGLOBIN: 13.6 g/dL (ref 13.0–17.0)
MCH: 30.9 pg (ref 26.0–34.0)
MCHC: 34.8 g/dL (ref 30.0–36.0)
MCV: 88.9 fL (ref 80.0–100.0)
PLATELETS: 114 10*3/uL — AB (ref 150–400)
RBC: 4.4 MIL/uL (ref 4.22–5.81)
RDW: 16.6 % — ABNORMAL HIGH (ref 11.5–15.5)
WBC: 6 10*3/uL (ref 4.0–10.5)
nRBC: 0 % (ref 0.0–0.2)

## 2017-11-24 LAB — FANA STAINING PATTERNS: Homogeneous Pattern: 1:80 {titer}

## 2017-11-24 LAB — HEPATITIS PANEL, ACUTE
Hep A IgM: NEGATIVE
Hep B C IgM: POSITIVE — AB
Hepatitis B Surface Ag: POSITIVE — AB

## 2017-11-24 LAB — IRON AND TIBC
Iron: 155 ug/dL (ref 45–182)
Saturation Ratios: 62 % — ABNORMAL HIGH (ref 17.9–39.5)
TIBC: 252 ug/dL (ref 250–450)
UIBC: 97 ug/dL

## 2017-11-24 LAB — ANTI-MICROSOMAL ANTIBODY LIVER / KIDNEY: LKM1 AB: 0.7 U (ref 0.0–20.0)

## 2017-11-24 LAB — CMV DNA, QUANTITATIVE, PCR
CMV DNA QUANT: NEGATIVE [IU]/mL
LOG10 CMV QN DNA PL: UNDETERMINED {Log_IU}/mL

## 2017-11-24 LAB — FERRITIN

## 2017-11-24 LAB — ANTI-SMOOTH MUSCLE ANTIBODY, IGG: F-ACTIN AB IGG: 19 U (ref 0–19)

## 2017-11-24 LAB — ANTINUCLEAR ANTIBODIES, IFA: ANTINUCLEAR ANTIBODIES, IFA: POSITIVE — AB

## 2017-11-24 LAB — EPSTEIN-BARR VIRUS EARLY D ANTIGEN ANTIBODY, IGG: EBV Early Antigen Ab, IgG: <9 U/mL (ref 0.0–8.9)

## 2017-11-24 NOTE — Progress Notes (Signed)
    CC:  Subjective: Sleeping soundly he says pills they gave him knocked him out.  No abdominal pain.    Objective: Vital signs in last 24 hours: Temp:  [98.3 F (36.8 C)-98.8 F (37.1 C)] 98.6 F (37 C) (10/23 0601) Pulse Rate:  [62-66] 62 (10/23 0601) Resp:  [16] 16 (10/23 0601) BP: (150-159)/(85-93) 157/93 (10/23 0601) SpO2:  [93 %-95 %] 95 % (10/23 0601) Last BM Date: 11/23/17 600 Po 572 IV Urine 300 Afebrile, VSS BP up some LFT's improving slowly CMP Latest Ref Rng & Units 11/24/2017 11/23/2017 11/22/2017  Total Bilirubin 0.3 - 1.2 mg/dL 13.4(H) 15.4(H) 15.7(H)  Alkaline Phos 38 - 126 U/L 151(H) 154(H) 157(H)  AST 15 - 41 U/L 1,700(H) 2,252(H) 2,438(H)  ALT 0 - 44 U/L 2,291(H) 2,901(H) 3,122(H)   WBC is normal RUQ QM:VHQIONGEX gallbladder wall. No gallstones identified. Acalculous cholecystitis could present and this fashion. Hypoproteinemic states could also present and this fashion. No biliary distention. Liver appears normal. Liver has normal echogenicity. No focal hepatic abnormality identified.  Intake/Output from previous day: 10/22 0701 - 10/23 0700 In: 1172.9 [P.O.:600; I.V.:572.9] Out: 300 [Urine:300] Intake/Output this shift: No intake/output data recorded.  General appearance: cooperative, no distress and I woke him from a sound sleep GI: soft, non-tender; bowel sounds normal; no masses,  no organomegaly  Lab Results:  Recent Labs    11/23/17 0522 11/24/17 0406  WBC 6.4 6.0  HGB 13.4 13.6  HCT 38.9* 39.1  PLT 129* 114*    BMET Recent Labs    11/23/17 0522 11/24/17 0406  NA 138 141  K 3.2* 3.6  CL 107 104  CO2 25 28  GLUCOSE 90 97  BUN 10 10  CREATININE 0.45* 0.63  CALCIUM 8.7* 8.5*   PT/INR Recent Labs    11/22/17 1710 11/24/17 0406  LABPROT 17.3* 16.2*  INR 1.44 1.31    Recent Labs  Lab 11/22/17 1710 11/23/17 0522 11/24/17 0406  AST 2,438* 2,252* 1,700*  ALT 3,122* 2,901* 2,291*  ALKPHOS 157* 154* 151*   BILITOT 15.7* 15.4* 13.4*  PROT 7.1 6.7 7.0  ALBUMIN 3.1* 2.9* 2.9*     Lipase     Component Value Date/Time   LIPASE 72 (H) 11/22/2017 1710     Medications: . gabapentin  600 mg Oral TID  . 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  Please call if we can help.    LOS: 2 days    Nowell Sites 11/24/2017 [phone]

## 2017-11-24 NOTE — Progress Notes (Signed)
[facility] Gastroenterology Progress Note  Peter Lara 72 y.o. [birth year]   Subjective: Feels ok. Denies abdominal pain.  Objective: Vital signs: Vitals:   11/23/17 2032 11/24/17 0601  BP: (!) 150/85 (!) 157/93  Pulse: 66 62  Resp: 16 16  Temp: 98.8 F (37.1 C) 98.6 F (37 C)  SpO2: 93% 95%    Physical Exam: Gen: alert, no acute distress  HEENT: anicteric sclera CV: RRR Chest: CTA B Abd: soft, nontender, nondistended, +BS Ext: no edema  Lab Results: Recent Labs    11/23/17 0522 11/24/17 0406  NA 138 141  K 3.2* 3.6  CL 107 104  CO2 25 28  GLUCOSE 90 97  BUN 10 10  CREATININE 0.45* 0.63  CALCIUM 8.7* 8.5*   Recent Labs    11/23/17 0522 11/24/17 0406  AST 2,252* 1,700*  ALT 2,901* 2,291*  ALKPHOS 154* 151*  BILITOT 15.4* 13.4*  PROT 6.7 7.0  ALBUMIN 2.9* 2.9*   Recent Labs    11/23/17 0522 11/24/17 0406  WBC 6.4 6.0  NEUTROABS 3.5  --   HGB 13.4 13.6  HCT 38.9* 39.1  MCV 88.8 88.9  PLT 129* 114*      Assessment/Plan: Acute Hepatitis B with positive HBVsAg and HBVcAb IgM. Will check HBV DNA and HBVeAg. Continue supportive care. Continue to follow as inpt until further improvement in LFTs. No signs of liver failure.   [name] 11/24/2017, 11:30 AM  Questions please call [phone] ID: Peter Lara, male   DOB: 1945/08/13, 72 y.o.   MRN: [id number]

## 2017-11-24 NOTE — Progress Notes (Signed)
PROGRESS NOTE    YOREL REDDER  WUJ:[id number] DOB: [birth year] DOA: 11/22/2017 PCP: Patient, No Pcp Per    Brief Narrative:  72 year old male who presented with jaundice.  Patient does have history of chronic back pain and depression.  Reported 7 days of right upper quadrant and lower abdominal pain, associated with diarrhea, no nausea no vomiting.  On the initial physical examination blood pressure 157/89, heart rate 72, respiratory rate 13, temperature 98.2, saturation 96%, positive jaundice, lungs clear to auscultation bilaterally, heart S1-2 present rhythmic, abdomen soft nontender, nondistended, no lower extremity edema.  No confusion or agitation.  Sodium 137, potassium 3.1, chloride 1 7, bicarb 24, glucose 108, BUN 9, creatinine 0.55, lipase 72, AST 2438, ALT 3122, bilirubin is 15.7.  INR 1.44.   Patient was admitted to the hospital working diagnosis of acute hepatitis.  Assessment & Plan:   Principal Problem:   Acute hepatitis Active Problems:   Chronic back pain   1. Acute hepatitis B. AST down to 1,700 with ALT 2,291, hepatitis B IGM positive, will continue supportive medical therapy. No signs of encephalopathy or coagulopathy. US liver with normal echogenicity. Patient tolerating diet well. Continue to follow liver function test and INR in am. Will need outpatient follow up for Hep B treatment. Possible dc in am. Continue to encourage ambulation.   2. Chronic back pain. Pain is well controlled, will hold on narcotics for now.  3. Gallbladder thickening. No stones, no nausea or vomiting, continue to advance diet as tolerated.    DVT prophylaxis: enoxaparin   Code Status: full Family Communication: no family at the bedside  Disposition Plan/ discharge barriers: pending clinical improvement    Consultants:    GI   surgery   Procedures:     Antimicrobials:       Subjective: Continue to have abdominal pain and generalized weakness, improved but not back to  baseline, no nausea or vomiting, no chest pain or dyspnea.   Objective: Vitals:   11/23/17 0501 11/23/17 1630 11/23/17 2032 11/24/17 0601  BP: (!) 152/84 (!) 159/91 (!) 150/85 (!) 157/93  Pulse: 62 63 66 62  Resp: 16 16 16 16   Temp: 98.9 F (37.2 C) 98.3 F (36.8 C) 98.8 F (37.1 C) 98.6 F (37 C)  TempSrc: Oral Oral Oral Oral  SpO2: 94% 95% 93% 95%    Intake/Output Summary (Last 24 hours) at 11/24/2017 1107 Last data filed at 11/24/2017 0200 Gross per 24 hour  Intake 932.9 ml  Output 300 ml  Net 632.9 ml   There were no vitals filed for this visit.  Examination:   General: deconditioned  Neurology: Awake and alert, non focal  E ENT: positive pallor, positive icterus, oral mucosa moist Cardiovascular: No JVD. S1-S2 present, rhythmic, no gallops, rubs, or murmurs. No lower extremity edema. Pulmonary: positive breath sounds bilaterally, adequate air movement, no wheezing, rhonchi or rales. Gastrointestinal. Abdomen positive distention with no organomegaly, non tender, no rebound or guarding Skin. No rashes Musculoskeletal: no joint deformities     Data Reviewed: I have personally reviewed following labs and imaging studies  CBC: Recent Labs  Lab 11/22/17 1710 11/23/17 0522 11/24/17 0406  WBC 7.0 6.4 6.0  NEUTROABS  --  3.5  --   HGB 13.2 13.4 13.6  HCT 38.6* 38.9* 39.1  MCV 90.0 88.8 88.9  PLT 138* 129* 114*   Basic Metabolic Panel: Recent Labs  Lab 11/22/17 1710 11/23/17 0522 11/24/17 0406  NA 137 138 141  K 3.1*  3.2* 3.6  CL 107 107 104  CO2 24 25 28   GLUCOSE 108* 90 97  BUN 9 10 10   CREATININE 0.55* 0.45* 0.63  CALCIUM 8.6* 8.7* 8.5*   GFR: CrCl cannot be calculated (Unknown ideal weight.). Liver Function Tests: Recent Labs  Lab 11/22/17 1710 11/23/17 0522 11/24/17 0406  AST 2,438* 2,252* 1,700*  ALT 3,122* 2,901* 2,291*  ALKPHOS 157* 154* 151*  BILITOT 15.7* 15.4* 13.4*  PROT 7.1 6.7 7.0  ALBUMIN 3.1* 2.9* 2.9*   Recent Labs  Lab  11/22/17 1710  LIPASE 72*   No results for input(s): AMMONIA in the last 168 hours. Coagulation Profile: Recent Labs  Lab 11/22/17 1710 11/24/17 0406  INR 1.44 1.31   Cardiac Enzymes: No results for input(s): CKTOTAL, CKMB, CKMBINDEX, TROPONINI in the last 168 hours. BNP (last 3 results) No results for input(s): PROBNP in the last 8760 hours. HbA1C: No results for input(s): HGBA1C in the last 72 hours. CBG: No results for input(s): GLUCAP in the last 168 hours. Lipid Profile: No results for input(s): CHOL, HDL, LDLCALC, TRIG, CHOLHDL, LDLDIRECT in the last 72 hours. Thyroid Function Tests: No results for input(s): TSH, T4TOTAL, FREET4, T3FREE, THYROIDAB in the last 72 hours. Anemia Panel: Recent Labs    11/24/17 0406  FERRITIN >7,500*  TIBC 252  IRON 155      Radiology Studies: I have reviewed all of the imaging during this hospital visit personally     Scheduled Meds: . gabapentin  600 mg Oral TID  . mirtazapine  15 mg Oral QHS   Continuous Infusions:   LOS: 2 days        Mauricio Annett Gula, MD [facility] Pager [phone]

## 2017-11-25 DIAGNOSIS — F329 Major depressive disorder, single episode, unspecified: Secondary | ICD-10-CM

## 2017-11-25 LAB — BASIC METABOLIC PANEL
ANION GAP: 9 (ref 5–15)
BUN: 12 mg/dL (ref 8–23)
CHLORIDE: 106 mmol/L (ref 98–111)
CO2: 25 mmol/L (ref 22–32)
Calcium: 8.7 mg/dL — ABNORMAL LOW (ref 8.9–10.3)
Creatinine, Ser: 0.79 mg/dL (ref 0.61–1.24)
Glucose, Bld: 99 mg/dL (ref 70–99)
POTASSIUM: 3.6 mmol/L (ref 3.5–5.1)
SODIUM: 140 mmol/L (ref 135–145)

## 2017-11-25 LAB — HEPATIC FUNCTION PANEL
ALBUMIN: 2.7 g/dL — AB (ref 3.5–5.0)
ALK PHOS: 147 U/L — AB (ref 38–126)
ALT: 1725 U/L — ABNORMAL HIGH (ref 0–44)
AST: 883 U/L — ABNORMAL HIGH (ref 15–41)
BILIRUBIN INDIRECT: 4.2 mg/dL — AB (ref 0.3–0.9)
Bilirubin, Direct: 5.7 mg/dL — ABNORMAL HIGH (ref 0.0–0.2)
TOTAL PROTEIN: 6.6 g/dL (ref 6.5–8.1)
Total Bilirubin: 9.9 mg/dL — ABNORMAL HIGH (ref 0.3–1.2)

## 2017-11-25 LAB — HEPATITIS B E ANTIGEN: HEP B E AG: POSITIVE — AB

## 2017-11-25 LAB — PROTIME-INR
INR: 1.15
PROTHROMBIN TIME: 14.6 s (ref 11.4–15.2)

## 2017-11-25 LAB — HEPATITIS B DNA, ULTRAQUANTITATIVE, PCR
HBV DNA SERPL PCR-ACNC: 96700 IU/mL
HBV DNA SERPL PCR-LOG IU: 4.985 log10 IU/mL

## 2017-11-25 LAB — IGG, IGA, IGM
IGA: 445 mg/dL — AB (ref 61–437)
IgG (Immunoglobin G), Serum: 1228 mg/dL (ref 700–1600)
IgM (Immunoglobulin M), Srm: 492 mg/dL — ABNORMAL HIGH (ref 20–172)

## 2017-11-25 LAB — AFP TUMOR MARKER: AFP, SERUM, TUMOR MARKER: 16.5 ng/mL — AB (ref 0.0–8.3)

## 2017-11-25 NOTE — Care Management Note (Signed)
Case Management Note  Patient Details  Name: Peter Lara MRN: [id number] Date of Birth: 1945/03/31  Subjective/Objective:     Discharged to home with self care               Action/Plan: Discharge to home with self care, orders checked for hhc needs. No CM needs present at time of discharge.  Expected Discharge Date:  11/25/17               Expected Discharge Plan:  Home/Self Care  In-House Referral:     Discharge planning Services  CM Consult  Post Acute Care Choice:    Choice offered to:     DME Arranged:    DME Agency:     HH Arranged:    HH Agency:     Status of Service:  Completed, signed off  If discussed at [employer] of Stay Meetings, dates discussed:    Additional Comments:  Golda Acre, RN 11/25/2017, 12:34 PM

## 2017-11-25 NOTE — Discharge Summary (Addendum)
Physician Discharge Summary  Peter Lara:[id number] DOB: [birth year] DOA: 11/22/2017  PCP: Patient, No Pcp Per  Admit date: 11/22/2017 Discharge date: 11/25/2017  Admitted From: Home  Disposition:  Home   Recommendations for Outpatient Follow-up and new medication changes:  1. Follow up with Primary Care 2. Follow up Dr. Bosie Clos in 3 weeks.  3. Avoid alcohol and acetaminophen. 4. Follow up liver function testing in 7 days.   Home Health: no   Equipment/Devices: no    Discharge Condition: stable  CODE STATUS: full  Diet recommendation:  Heart healthy   Brief/Interim Summary: 72 year old male who presented with jaundice.  Patient does have the significant past medical history of chronic back pain and depression.  Reported 7 days of right upper quadrant and lower abdominal pain, associated with diarrhea, no nausea and no vomiting.  On the initial physical examination blood pressure 157/89, heart rate 72, respiratory rate 13, temperature 98.2, saturation 96%, positive jaundice, lungs clear to auscultation bilaterally, heart S1-S2 present rhythmic, abdomen soft nontender, nondistended, no lower extremity edema.  No confusion or agitation.  Sodium 137, potassium 3.1, chloride 107, bicarb 24, glucose 108, BUN 9, creatinine 0.55, lipase 72, AST 2438, ALT 3122, bilirubin is 15.7.  INR 1.44.   White cell count 7.0, hemoglobin 13.2, hematocrit 38.6, platelets 138.  Urinalysis with 6-10 white cells, 21-50 red cells.  Drug screen positive for opiates.  CT of the abdomen with pericholecystic fluid/edema, questionable gallbladder wall thickening.  Mild fatty liver infiltration.  Abdominal ultrasound with gallbladder wall thickening, no gallstones.  Normal liver appearance.  Patient was admitted to the hospital working diagnosis of acute hepatitis.  1.  Acute hepatitis B.  Patient was admitted to the medical ward, he was placed on supportive medical therapy, the transaminases were closely  followed, his abdominal pain improved, no nausea, no vomiting, no confusion or coagulopathy.  At discharge his AST is 883, ALT 1,725, bilirubin 9.9, (direct 5.7 indirect 4,2).  INR 1.15.  Serologies confirm acute hepatitis B with positive B surface antigen, E antigen, core antibody (IgM).  Acetaminophen level less than 10. Patient will need follow-up as an outpatient to evaluate posible interferon therapy.  Patient was instructed to avoid acetaminophen and alcohol.  His hepatitis A and C are negative, HIV screen was nonreactive.  2.  Gallbladder thickening.  Patient rule out for acute cholecystitis, his uterus was associated with acute hepatitis.  No surgical intervention indicated.  3.  Chronic back pain.  Continue pain controlled with analgesics, avoid acetaminophen.  Continue gabapentin.  4. Depression. Continue sertraline.   Discharge Diagnoses:  Principal Problem:   Acute hepatitis Active Problems:   Chronic back pain    Discharge Instructions   Allergies as of 11/25/2017   No Known Allergies     Medication List    STOP taking these medications   HYDROcodone-acetaminophen 7.5-325 MG tablet Commonly known as:  NORCO   meloxicam 15 MG tablet Commonly known as:  MOBIC     TAKE these medications   EYE DROPS OP Apply 1 drop to eye daily as needed (dry eyes).   gabapentin 600 MG tablet Commonly known as:  NEURONTIN Take 600 mg by mouth 3 (three) times daily.   mirtazapine 15 MG tablet Commonly known as:  REMERON Take 15 mg by mouth at bedtime.   sertraline 50 MG tablet Commonly known as:  ZOLOFT Take 75 mg by mouth daily.       No Known Allergies  Consultations:  GI  Surgery    Procedures/Studies: Ct Chest W Contrast  Result Date: 11/22/2017 CLINICAL DATA:  Jaundice. EXAM: CT CHEST, ABDOMEN, AND PELVIS WITH CONTRAST TECHNIQUE: Multidetector CT imaging of the chest, abdomen and pelvis was performed following the standard protocol during bolus  administration of intravenous contrast. CONTRAST:  ISOVUE-300 IOPAMIDOL (ISOVUE-300) INJECTION 61% COMPARISON:  None. FINDINGS: CT CHEST FINDINGS Cardiovascular: Heart size is normal. No pericardial effusion. Mild aortic atherosclerosis. No thoracic aortic aneurysm. Mediastinum/Nodes: No mass or enlarged lymph nodes seen within the mediastinum or perihilar regions. Esophagus appears normal. Trachea and central bronchi are unremarkable. Lungs/Pleura: Mild bibasilar atelectasis/scarring. Lungs otherwise clear. No pleural effusion or pneumothorax. Musculoskeletal: Mild degenerative spondylosis of the thoracic spine. No acute or suspicious osseous finding. CT ABDOMEN PELVIS FINDINGS Hepatobiliary: No focal liver abnormality. Mild fatty infiltration of the liver. Questionable gallbladder wall thickening. There is pericholecystic fluid/edema. No bile duct stone seen. Common bile duct appears upper normal in caliber. Pancreas: Unremarkable. No pancreatic ductal dilatation or surrounding inflammatory changes. Spleen: Normal in size without focal abnormality. Adrenals/Urinary Tract: Adrenal glands are unremarkable. Kidneys are normal, without renal calculi, focal lesion, or hydronephrosis. Bladder is unremarkable. Stomach/Bowel: No dilated large or small bowel loops. Vascular/Lymphatic: No acute appearing vascular abnormality. Mild aortic atherosclerosis. Several enlarged lymph nodes are seen in the RIGHT upper quadrant, including a 15 mm short axis lymph node in the portacaval space (series 2, image 66), likely reactive in nature. Reproductive: Prostate gland is moderately enlarged causing slight mass effect on the bladder base. Other: Small amount of free fluid in the lower pelvis. No abscess collection identified. No free intraperitoneal air. Musculoskeletal: No acute or suspicious osseous finding. Degenerative spondylosis of the lumbar spine, mild to moderate in degree. LEFT inguinal hernia which contains fat only.  IMPRESSION: 1. Pericholecystic fluid/edema and questionable gallbladder wall thickening, highly suspicious for acute cholecystitis. Recommend confirmation with RIGHT upper quadrant ultrasound. 2. Common bile duct appears to be within normal limits in caliber, perhaps borderline dilated. No bile duct stone identified. More definitive CBD measurement can be obtained on the RIGHT upper quadrant ultrasound recommended above. 3. Mild fatty infiltration of the liver. 4. Enlarged lymph nodes in the RIGHT upper quadrant, likely reactive in nature. 5. No acute findings within the chest. 6. Moderate prostate gland enlargement. Consider correlation with PSA lab values. Electronically Signed   By: Bary Richard M.D.   On: 11/22/2017 21:35   Ct Abdomen Pelvis W Contrast  Result Date: 11/22/2017 CLINICAL DATA:  Jaundice. EXAM: CT CHEST, ABDOMEN, AND PELVIS WITH CONTRAST TECHNIQUE: Multidetector CT imaging of the chest, abdomen and pelvis was performed following the standard protocol during bolus administration of intravenous contrast. CONTRAST:  ISOVUE-300 IOPAMIDOL (ISOVUE-300) INJECTION 61% COMPARISON:  None. FINDINGS: CT CHEST FINDINGS Cardiovascular: Heart size is normal. No pericardial effusion. Mild aortic atherosclerosis. No thoracic aortic aneurysm. Mediastinum/Nodes: No mass or enlarged lymph nodes seen within the mediastinum or perihilar regions. Esophagus appears normal. Trachea and central bronchi are unremarkable. Lungs/Pleura: Mild bibasilar atelectasis/scarring. Lungs otherwise clear. No pleural effusion or pneumothorax. Musculoskeletal: Mild degenerative spondylosis of the thoracic spine. No acute or suspicious osseous finding. CT ABDOMEN PELVIS FINDINGS Hepatobiliary: No focal liver abnormality. Mild fatty infiltration of the liver. Questionable gallbladder wall thickening. There is pericholecystic fluid/edema. No bile duct stone seen. Common bile duct appears upper normal in caliber. Pancreas:  Unremarkable. No pancreatic ductal dilatation or surrounding inflammatory changes. Spleen: Normal in size without focal abnormality. Adrenals/Urinary Tract: Adrenal glands are unremarkable. Kidneys are normal,  without renal calculi, focal lesion, or hydronephrosis. Bladder is unremarkable. Stomach/Bowel: No dilated large or small bowel loops. Vascular/Lymphatic: No acute appearing vascular abnormality. Mild aortic atherosclerosis. Several enlarged lymph nodes are seen in the RIGHT upper quadrant, including a 15 mm short axis lymph node in the portacaval space (series 2, image 66), likely reactive in nature. Reproductive: Prostate gland is moderately enlarged causing slight mass effect on the bladder base. Other: Small amount of free fluid in the lower pelvis. No abscess collection identified. No free intraperitoneal air. Musculoskeletal: No acute or suspicious osseous finding. Degenerative spondylosis of the lumbar spine, mild to moderate in degree. LEFT inguinal hernia which contains fat only. IMPRESSION: 1. Pericholecystic fluid/edema and questionable gallbladder wall thickening, highly suspicious for acute cholecystitis. Recommend confirmation with RIGHT upper quadrant ultrasound. 2. Common bile duct appears to be within normal limits in caliber, perhaps borderline dilated. No bile duct stone identified. More definitive CBD measurement can be obtained on the RIGHT upper quadrant ultrasound recommended above. 3. Mild fatty infiltration of the liver. 4. Enlarged lymph nodes in the RIGHT upper quadrant, likely reactive in nature. 5. No acute findings within the chest. 6. Moderate prostate gland enlargement. Consider correlation with PSA lab values. Electronically Signed   By: Bary Richard M.D.   On: 11/22/2017 21:35   US Abdomen Limited Ruq  Result Date: 11/23/2017 CLINICAL DATA:  Elevated transaminases. EXAM: ULTRASOUND ABDOMEN LIMITED RIGHT UPPER QUADRANT COMPARISON:  CT 11/22/2017. FINDINGS: Gallbladder: No  gallstones gallbladder wall is thickened at 5.1 mm. No sonographic Murphy sign noted by sonographer. Common bile duct: Diameter: 6.6 mm Liver: No focal lesion identified. Within normal limits in parenchymal echogenicity. Portal vein is patent on color Doppler imaging with normal direction of blood flow towards the liver. IMPRESSION: 1. Thickened gallbladder wall. No gallstones identified. Acalculous cholecystitis could present and this fashion. Hypoproteinemic states could also present and this fashion. No biliary distention. 2. Liver appears normal. Liver has normal echogenicity. No focal hepatic abnormality identified. Electronically Signed   By: Maisie Fus  Register   On: 11/23/2017 15:58       Subjective: Patient is feeling well, no nausea or vomiting, no chest pain or dyspnea.   Discharge Exam: Vitals:   11/24/17 2046 11/25/17 0438  BP: (!) 169/90 (!) 149/72  Pulse: 66 (!) 56  Resp: 18 20  Temp: 98.6 F (37 C) 98.7 F (37.1 C)  SpO2: 96% 94%   Vitals:   11/24/17 1340 11/24/17 1400 11/24/17 2046 11/25/17 0438  BP:  (!) 153/94 (!) 169/90 (!) 149/72  Pulse:  60 66 (!) 56  Resp:  18 18 20   Temp:  98 F (36.7 C) 98.6 F (37 C) 98.7 F (37.1 C)  TempSrc:  Oral Oral Oral  SpO2: 95% 96% 96% 94%    General: Not in pain or dyspnea,  Neurology: Awake and alert, non focal  E ENT: no pallor, mild icterus, oral mucosa moist Cardiovascular: No JVD. S1-S2 present, rhythmic, no gallops, rubs, or murmurs. No lower extremity edema. Pulmonary: positive breath sounds bilaterally, adequate air movement, no wheezing, rhonchi or rales. Gastrointestinal. Abdomen mild distended with no organomegaly, non tender, no rebound or guarding Skin. No rashes Musculoskeletal: no joint deformities   The results of significant diagnostics from this hospitalization (including imaging, microbiology, ancillary and laboratory) are listed below for reference.     Microbiology: No results found for this or any  previous visit (from the past 240 hour(s)).   Labs: BNP (last 3 results) No results  for input(s): BNP in the last 8760 hours. Basic Metabolic Panel: Recent Labs  Lab 11/22/17 1710 11/23/17 0522 11/24/17 0406 11/25/17 0402  NA 137 138 141 140  K 3.1* 3.2* 3.6 3.6  CL 107 107 104 106  CO2 24 25 28 25   GLUCOSE 108* 90 97 99  BUN 9 10 10 12   CREATININE 0.55* 0.45* 0.63 0.79  CALCIUM 8.6* 8.7* 8.5* 8.7*   Liver Function Tests: Recent Labs  Lab 11/22/17 1710 11/23/17 0522 11/24/17 0406 11/25/17 0402  AST 2,438* 2,252* 1,700* 883*  ALT 3,122* 2,901* 2,291* 1,725*  ALKPHOS 157* 154* 151* 147*  BILITOT 15.7* 15.4* 13.4* 9.9*  PROT 7.1 6.7 7.0 6.6  ALBUMIN 3.1* 2.9* 2.9* 2.7*   Recent Labs  Lab 11/22/17 1710  LIPASE 72*   No results for input(s): AMMONIA in the last 168 hours. CBC: Recent Labs  Lab 11/22/17 1710 11/23/17 0522 11/24/17 0406  WBC 7.0 6.4 6.0  NEUTROABS  --  3.5  --   HGB 13.2 13.4 13.6  HCT 38.6* 38.9* 39.1  MCV 90.0 88.8 88.9  PLT 138* 129* 114*   Cardiac Enzymes: No results for input(s): CKTOTAL, CKMB, CKMBINDEX, TROPONINI in the last 168 hours. BNP: Invalid input(s): POCBNP CBG: No results for input(s): GLUCAP in the last 168 hours. D-Dimer No results for input(s): DDIMER in the last 72 hours. Hgb A1c No results for input(s): HGBA1C in the last 72 hours. Lipid Profile No results for input(s): CHOL, HDL, LDLCALC, TRIG, CHOLHDL, LDLDIRECT in the last 72 hours. Thyroid function studies No results for input(s): TSH, T4TOTAL, T3FREE, THYROIDAB in the last 72 hours.  Invalid input(s): FREET3 Anemia work up Recent Labs    11/24/17 0406  FERRITIN >7,500*  TIBC 252  IRON 155   Urinalysis    Component Value Date/Time   COLORURINE AMBER (A) 11/22/2017 1800   APPEARANCEUR HAZY (A) 11/22/2017 1800   LABSPEC 1.023 11/22/2017 1800   PHURINE 6.0 11/22/2017 1800   GLUCOSEU NEGATIVE 11/22/2017 1800   HGBUR NEGATIVE 11/22/2017 1800    BILIRUBINUR MODERATE (A) 11/22/2017 1800   KETONESUR NEGATIVE 11/22/2017 1800   PROTEINUR 30 (A) 11/22/2017 1800   NITRITE NEGATIVE 11/22/2017 1800   LEUKOCYTESUR NEGATIVE 11/22/2017 1800   Sepsis Labs Invalid input(s): PROCALCITONIN,  WBC,  LACTICIDVEN Microbiology No results found for this or any previous visit (from the past 240 hour(s)).   Time coordinating discharge: 45 minutes  SIGNED:   Coralie Keens, MD  [facility] 11/25/2017, 8:50 AM Pager [phone]  If 7PM-7AM, please contact night-coverage [email or web address] Password [email or web address]

## 2017-11-25 NOTE — Evaluation (Signed)
Physical Therapy Evaluation Patient Details Name: Peter Lara MRN: [id number] DOB: Dec 07, 1945 Today's Date: 11/25/2017   History of Present Illness  72 year old male who presented with jaundice.  history of chronic back pain and depression. admitted upper quadrant and lower abdominal pain, dx acute Hep B  Clinical Impression  Patient evaluated by Physical Therapy with no further acute PT needs identified. All education has been completed and the patient has no further questions. Pt amb ~ 380', no AD needed, no LOB although Mildly unsteady initially which improved with distance; See below for any follow-up Physical Therapy or equipment needs. PT is signing off. Thank you for this referral.     Follow Up Recommendations No PT follow up    Equipment Recommendations  None recommended by PT    Recommendations for Other Services       Precautions / Restrictions Precautions Precautions: None      Mobility  Bed Mobility Overal bed mobility: Needs Assistance Bed Mobility: Supine to Sit     Supine to sit: Independent        Transfers Overall transfer level: Modified independent Equipment used: None                Ambulation/Gait Ambulation/Gait assistance: Modified independent (Device/Increase time) Gait Distance (Feet): 380 Feet Assistive device: None Gait Pattern/deviations: WFL(Within Functional Limits)     General Gait Details: unsteady initially--improved with distance no LOB,    Stairs            Wheelchair Mobility    Modified Rankin (Stroke Patients Only)       Balance                                             Pertinent Vitals/Pain Pain Assessment: No/denies pain    Home Living Family/patient expects to be discharged to:: Private residence Living Arrangements: Alone                    Prior Function                 Hand Dominance        Extremity/Trunk Assessment   Upper Extremity  Assessment Upper Extremity Assessment: Overall WFL for tasks assessed    Lower Extremity Assessment Lower Extremity Assessment: Overall WFL for tasks assessed       Communication   Communication: No difficulties  Cognition Arousal/Alertness: Awake/alert Behavior During Therapy: WFL for tasks assessed/performed Overall Cognitive Status: Within Functional Limits for tasks assessed                                        General Comments      Exercises     Assessment/Plan    PT Assessment Patent does not need any further PT services  PT Problem List         PT Treatment Interventions      PT Goals (Current goals can be found in the Care Plan section)  Acute Rehab PT Goals PT Goal Formulation: All assessment and education complete, DC therapy    Frequency     Barriers to discharge        Co-evaluation               AM-PAC PT "6 Clicks" Daily Activity  Outcome Measure Difficulty turning over in bed (including adjusting bedclothes, sheets and blankets)?: None Difficulty moving from lying on back to sitting on the side of the bed? : None Difficulty sitting down on and standing up from a chair with arms (e.g., wheelchair, bedside commode, etc,.)?: None Help needed moving to and from a bed to chair (including a wheelchair)?: None Help needed walking in hospital room?: None Help needed climbing 3-5 steps with a railing? : None 6 Click Score: 24    End of Session   Activity Tolerance: Patient tolerated treatment well Patient left: with call bell/phone within reach;Other (comment)(on window seat)        Time: 1610-9604 PT Time Calculation (min) (ACUTE ONLY): 12 min   Charges:   PT Evaluation $PT Eval Low Complexity: 1 Low          Drucilla Chalet, PT  Pager: [phone] Acute Rehab Dept [facility]): [phone]   11/25/2017   [facility] 11/25/2017, 12:37 PM

## 2017-11-25 NOTE — Plan of Care (Signed)
Patient discharged home. IV removed. No new Rx. Instructions given to patient and he verbalized understanding. Patient left in stable condition

## 2019-04-14 IMAGING — CT CT CHEST W/ CM
2 of 5 series · 12 of 36 positions shown, 15 images · IV contrast (ISOVUE)
Comparison: None.

CLINICAL DATA: Jaundice.

EXAM:
CT CHEST, ABDOMEN, AND PELVIS WITH CONTRAST
TECHNIQUE: Multidetector CT imaging of the chest, abdomen and pelvis was
performed following the standard protocol during bolus
administration of intravenous contrast.
CONTRAST:  100mL SPFJ7M-0AA IOPAMIDOL (SPFJ7M-0AA) INJECTION 61%

[Series 2: cap with · axial · 0.78mm/px · z∈[+1182,+1722]mm · 9 of 136 slices shown, 12 images]
[im 14/136  mediastinal]
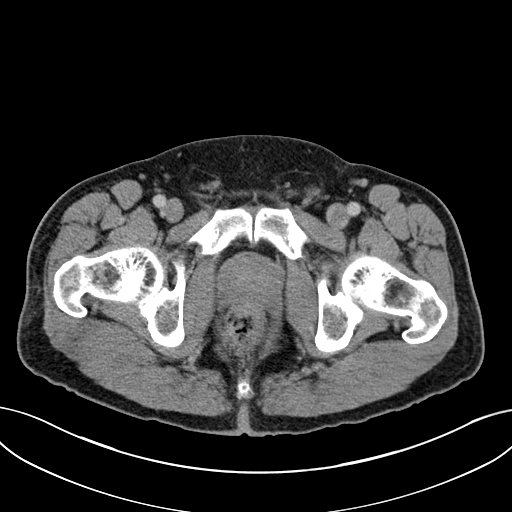
[im 14/136  lung]
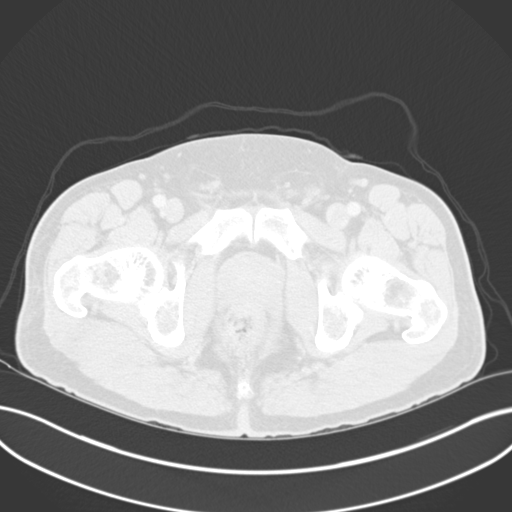
[im 28/136  lung]
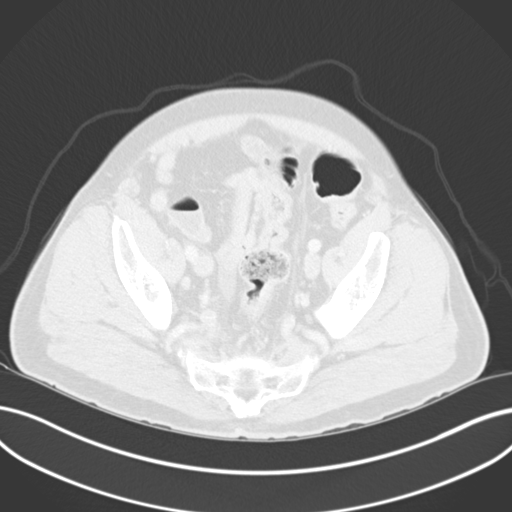
[im 41/136  lung]
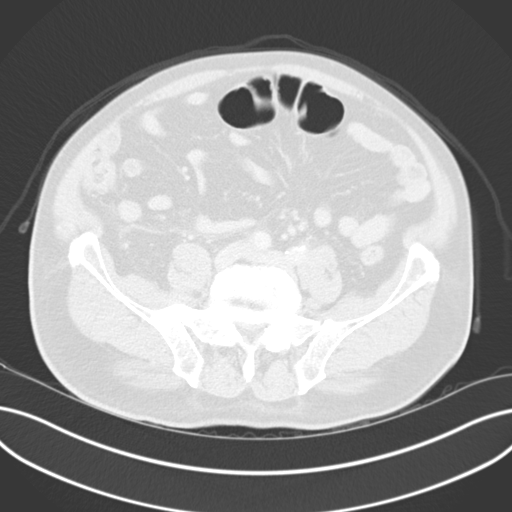
[im 55/136  lung]
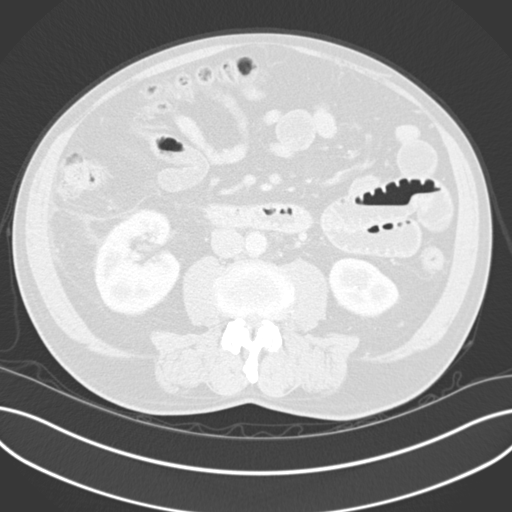
[im 68/136  mediastinal]
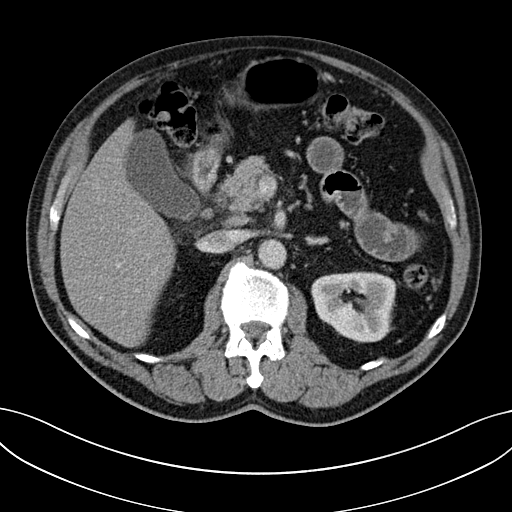
[im 68/136  lung]
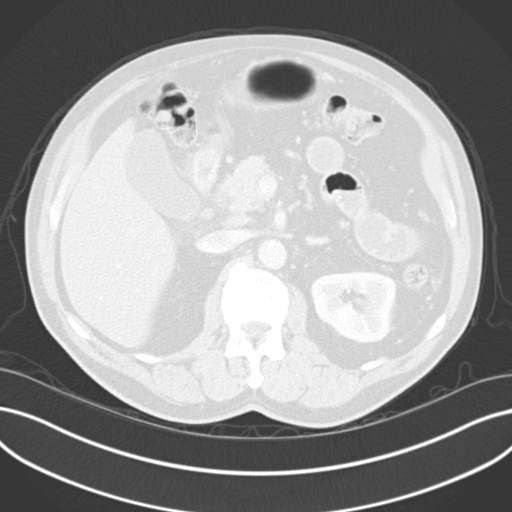
[im 82/136  lung]
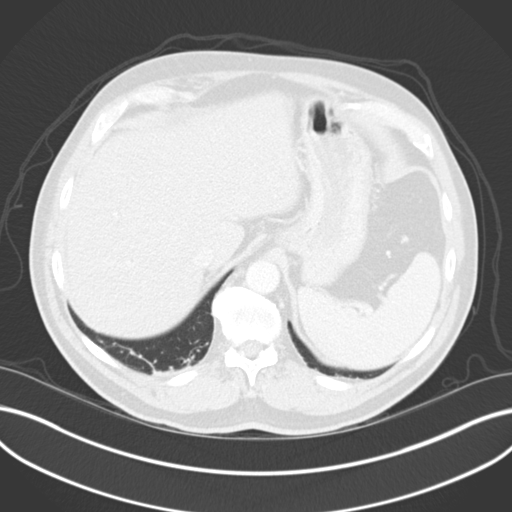
[im 95/136  lung]
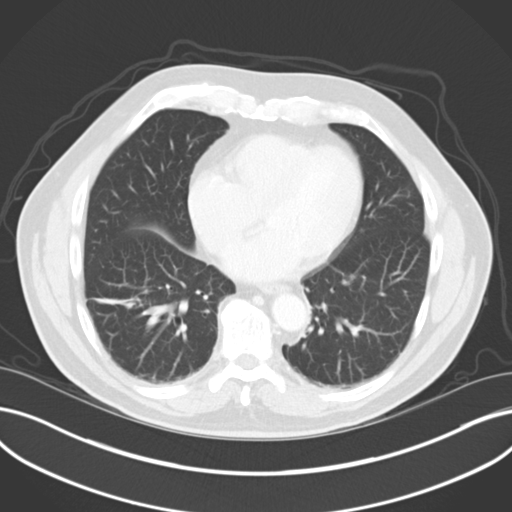
[im 109/136  lung]
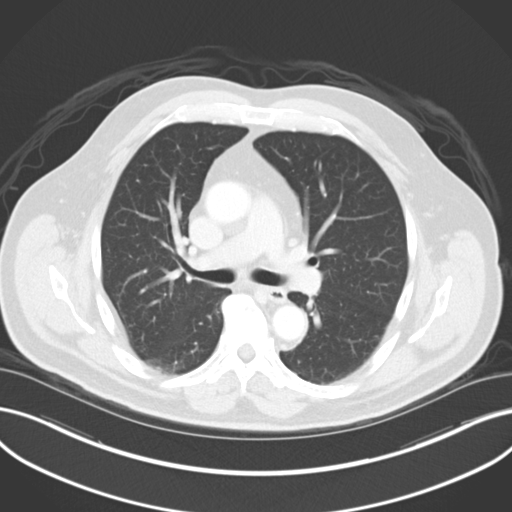
[im 122/136  mediastinal]
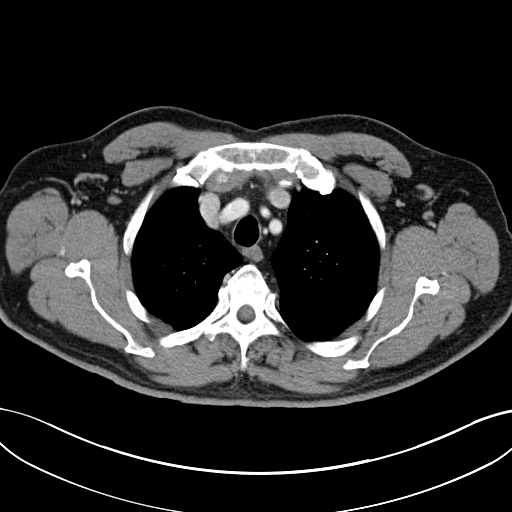
[im 122/136  lung]
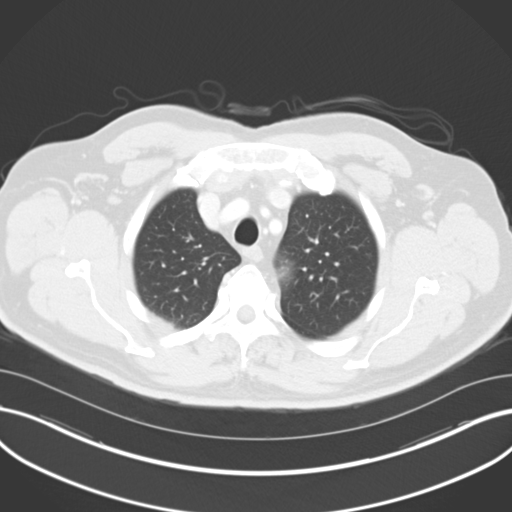

[Series 5: coronals · coronal · 0.84mm/px · 3 of 151 slices shown]
[im 31/151  lung]
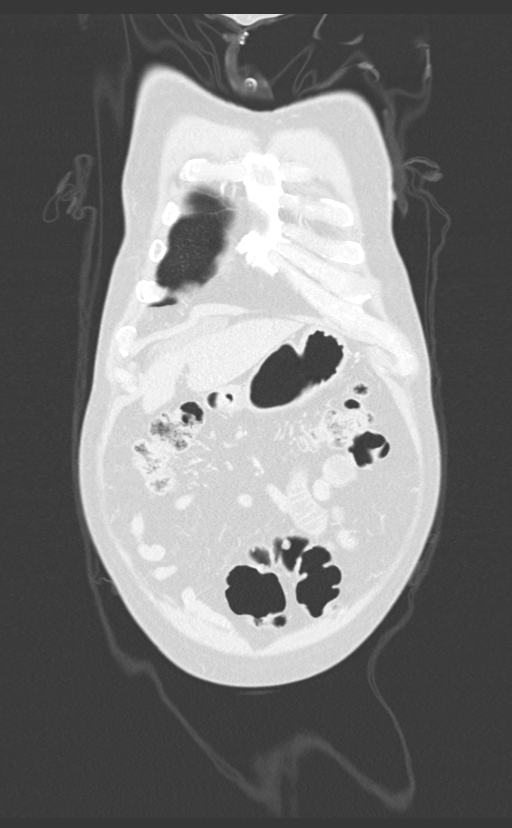
[im 61/151  lung]
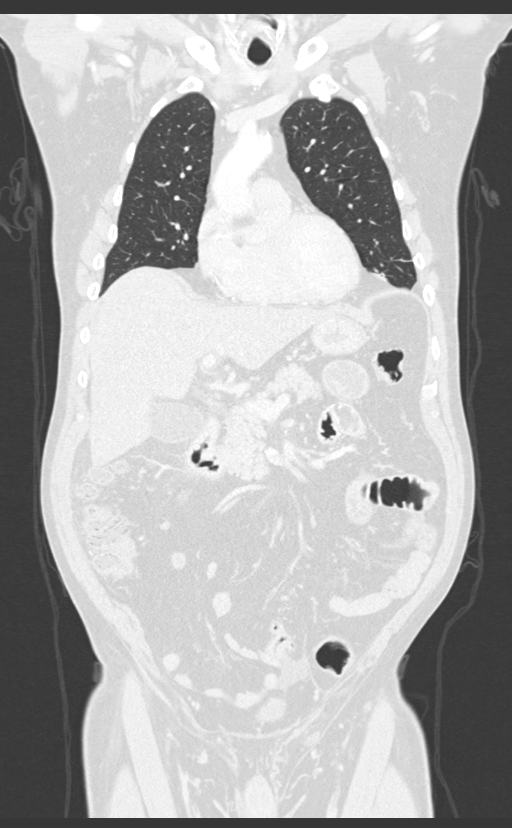
[im 91/151  lung]
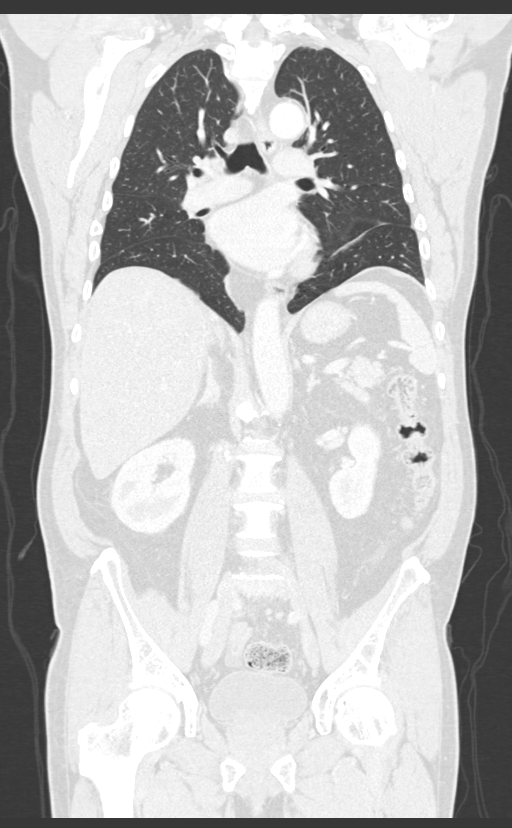

[12 of 36 positions shown; findings below may reference images not displayed]

FINDINGS: CT CHEST FINDINGS

Cardiovascular: Heart size is normal. No pericardial effusion. Mild
aortic atherosclerosis. No thoracic aortic aneurysm.

Mediastinum/Nodes: No mass or enlarged lymph nodes seen within the
mediastinum or perihilar regions. Esophagus appears normal. Trachea
and central bronchi are unremarkable.

Lungs/Pleura: Mild bibasilar atelectasis/scarring. Lungs otherwise
clear. No pleural effusion or pneumothorax.

Musculoskeletal: Mild degenerative spondylosis of the thoracic
spine. No acute or suspicious osseous finding.

CT ABDOMEN PELVIS FINDINGS

Hepatobiliary: No focal liver abnormality. Mild fatty infiltration
of the liver. Questionable gallbladder wall thickening. There is
pericholecystic fluid/edema. No bile duct stone seen. Common bile
duct appears upper normal in caliber.

Pancreas: Unremarkable. No pancreatic ductal dilatation or
surrounding inflammatory changes.

Spleen: Normal in size without focal abnormality.

Adrenals/Urinary Tract: Adrenal glands are unremarkable. Kidneys are
normal, without renal calculi, focal lesion, or hydronephrosis.
Bladder is unremarkable.

Stomach/Bowel: No dilated large or small bowel loops.

Vascular/Lymphatic: No acute appearing vascular abnormality. Mild
aortic atherosclerosis. Several enlarged lymph nodes are seen in the
RIGHT upper quadrant, including a 15 mm short axis lymph node in the
portacaval space (series 2, image 66), likely reactive in nature.

Reproductive: Prostate gland is moderately enlarged causing slight
mass effect on the bladder base.

Other: Small amount of free fluid in the lower pelvis. No abscess
collection identified. No free intraperitoneal air.

Musculoskeletal: No acute or suspicious osseous finding.
Degenerative spondylosis of the lumbar spine, mild to moderate in
degree.

LEFT inguinal hernia which contains fat only.
IMPRESSION: 1. Pericholecystic fluid/edema and questionable gallbladder wall
thickening, highly suspicious for acute cholecystitis. Recommend
confirmation with RIGHT upper quadrant ultrasound.
2. Common bile duct appears to be within normal limits in caliber,
perhaps borderline dilated. No bile duct stone identified. More
definitive CBD measurement can be obtained on the RIGHT upper
quadrant ultrasound recommended above.
3. Mild fatty infiltration of the liver.
4. Enlarged lymph nodes in the RIGHT upper quadrant, likely reactive
in nature.
5. No acute findings within the chest.
6. Moderate prostate gland enlargement. Consider correlation with
PSA lab values.

## 2021-03-12 DIAGNOSIS — R404 Transient alteration of awareness: Secondary | ICD-10-CM | POA: Diagnosis not present

## 2021-03-12 DIAGNOSIS — I499 Cardiac arrhythmia, unspecified: Secondary | ICD-10-CM | POA: Diagnosis not present

## 2021-03-12 DIAGNOSIS — R0689 Other abnormalities of breathing: Secondary | ICD-10-CM | POA: Diagnosis not present

## 2021-04-02 DIAGNOSIS — 419620001 Death: Secondary | SNOMED CT | POA: Diagnosis not present

## 2021-04-02 DEATH — deceased
